# Patient Record
Sex: Male | Born: 1965
Health system: Southern US, Community
[De-identification: ages and names within clinical notes are randomized; demographics above are authoritative.]

## PROBLEM LIST (undated history)

## (undated) DIAGNOSIS — I10 Essential (primary) hypertension: Secondary | ICD-10-CM

## (undated) HISTORY — PX: HERNIA REPAIR: SHX51

---

## 2013-10-18 ENCOUNTER — Ambulatory Visit (INDEPENDENT_AMBULATORY_CARE_PROVIDER_SITE_OTHER): Payer: 59 | Admitting: Family Medicine

## 2013-10-18 VITALS — BP 146/78 | HR 98 | Temp 98.5°F | Resp 16 | Ht 67.2 in | Wt 186.6 lb

## 2013-10-18 DIAGNOSIS — B349 Viral infection, unspecified: Secondary | ICD-10-CM

## 2013-10-18 DIAGNOSIS — R51 Headache: Secondary | ICD-10-CM

## 2013-10-18 DIAGNOSIS — I10 Essential (primary) hypertension: Secondary | ICD-10-CM

## 2013-10-18 DIAGNOSIS — F3289 Other specified depressive episodes: Secondary | ICD-10-CM

## 2013-10-18 DIAGNOSIS — F32A Depression, unspecified: Secondary | ICD-10-CM

## 2013-10-18 DIAGNOSIS — B9789 Other viral agents as the cause of diseases classified elsewhere: Secondary | ICD-10-CM

## 2013-10-18 DIAGNOSIS — F329 Major depressive disorder, single episode, unspecified: Secondary | ICD-10-CM

## 2013-10-18 LAB — POCT CBC
Granulocyte percent: 65.8 %G (ref 37–80)
HCT, POC: 50.6 % (ref 43.5–53.7)
Hemoglobin: 16.3 g/dL (ref 14.1–18.1)
Lymph, poc: 2.6 (ref 0.6–3.4)
MCH, POC: 26.8 pg — AB (ref 27–31.2)
MCHC: 32.3 g/dL (ref 31.8–35.4)
MCV: 83 fL (ref 80–97)
MID (cbc): 0.6 (ref 0–0.9)
MPV: 9 fL (ref 0–99.8)
POC Granulocyte: 6.3 (ref 2–6.9)
POC LYMPH PERCENT: 27.6 %L (ref 10–50)
POC MID %: 6.6 %M (ref 0–12)
Platelet Count, POC: 230 10*3/uL (ref 142–424)
RBC: 6.09 M/uL (ref 4.69–6.13)
RDW, POC: 12.8 %
WBC: 9.5 10*3/uL (ref 4.6–10.2)

## 2013-10-18 LAB — COMPREHENSIVE METABOLIC PANEL
ALT: 23 U/L (ref 0–53)
AST: 28 U/L (ref 0–37)
Albumin: 5.1 g/dL (ref 3.5–5.2)
Alkaline Phosphatase: 56 U/L (ref 39–117)
BUN: 13 mg/dL (ref 6–23)
CO2: 28 mEq/L (ref 19–32)
Calcium: 10.3 mg/dL (ref 8.4–10.5)
Chloride: 100 mEq/L (ref 96–112)
Creat: 0.83 mg/dL (ref 0.50–1.35)
Glucose, Bld: 93 mg/dL (ref 70–99)
Potassium: 3.7 mEq/L (ref 3.5–5.3)
Sodium: 137 mEq/L (ref 135–145)
Total Bilirubin: 0.8 mg/dL (ref 0.2–1.2)
Total Protein: 8.6 g/dL — ABNORMAL HIGH (ref 6.0–8.3)

## 2013-10-18 MED ORDER — CITALOPRAM HYDROBROMIDE 20 MG PO TABS
20.0000 mg | ORAL_TABLET | Freq: Every day | ORAL | Status: DC
Start: 2013-10-18 — End: 2013-12-06

## 2013-10-18 MED ORDER — HYDROCHLOROTHIAZIDE 12.5 MG PO CAPS: 12.5000 mg | ORAL_CAPSULE | Freq: Every day | ORAL | Status: DC

## 2013-10-18 NOTE — Progress Notes (Signed)
Subjective:    Patient ID: Juan [name], male    DOB: 1965/05/12, 48 y.o.   MRN: [id number] This chart was scribed for Elvina SidleKurt Lauenstein, MD by Chestine SporeSoijett Blue, ED Scribe. The patient was seen in room 9 at 1:57 PM.   No chief complaint on file.   HPI HPI Comments: Juan [name] is a 48 y.o. male who presents to the Emergency Department complaining of a HA onset yesterday. He states that the HA is general. He states that his BP was 160/114 on his left arm. He states that he is having associated symptoms of pressure feeling in the back of the calf on both legs, diarrhea. He denies cough, SOB, fever, dysuria. He states that he took HCTZ medication with mild relief. He states that a former doctor informed him that it will lead to sexual issues.  He states that he is unemployed. 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 He states that his father has a hx of 3 strokes with the first one being at age 46. He states that his brother also had a stroke. He states that he does not have a PCP. He states that he is married and he is going through a split with her. He states that the stress has him feeling down. He states that he was married for 12 years. He states that he does want to get back with his wife.   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 He states that he is not sleeping enough. He denies taking any medications to help him through tough times. He states that he has been thinking about seeing a psychologist for the issues.   Pt BP on the right arm is 130/80 in the office. Pt BP on the left arm is 110/80 in the office. He states that he feels nauseated while in the office. Pulse 80 beats per minutes.   There are no active problems to display for this patient.  No past medical history on file. No past surgical history on file. Allergies not on file Prior to Admission medications   Not on File     Review of Systems  Constitutional: Negative for fever.  Respiratory: Negative for  cough and shortness of breath.   Genitourinary: Negative for dysuria.  Neurological: Positive for headaches.       Objective:   Physical Exam  Nursing note and vitals reviewed. Constitutional: He is oriented to person, place, and time. He appears well-developed and well-nourished. No distress.  HENT:  Head: Normocephalic and atraumatic.  Cerumen impaction on the right TM  Eyes: EOM are normal.  Neck: Normal range of motion. Neck supple. No tracheal deviation present.  Cardiovascular: Normal rate, regular rhythm and normal heart sounds.   No murmur heard. Pulmonary/Chest: Effort normal and breath sounds normal. No respiratory distress.  Musculoskeletal: Normal range of motion. He exhibits no edema.  Lymphadenopathy:    He has no cervical adenopathy.  Neurological: He is alert and oriented to person, place, and time.  Skin: Skin is warm and dry.  Psychiatric: His behavior is normal. Judgment normal.  Patient weeps when discussing recent separation from wife and daughter.    Results for orders placed in visit on 10/18/13  POCT CBC      Result Value Ref Range   WBC 9.5  4.6 - 10.2 K/uL   Lymph, poc 2.6  0.6 - 3.4   POC LYMPH PERCENT 27.6  10 - 50 %L  MID (cbc) 0.6  0 - 0.9   POC MID % 6.6  0 - 12 %M   POC Granulocyte 6.3  2 - 6.9   Granulocyte percent 65.8  37 - 80 %G   RBC 6.09  4.69 - 6.13 M/uL   Hemoglobin 16.3  14.1 - 18.1 g/dL   HCT, POC 16.1  09.6 - 53.7 %   MCV 83.0  80 - 97 fL   MCH, POC 26.8 (*) 27 - 31.2 pg   MCHC 32.3  31.8 - 35.4 g/dL   RDW, POC 04.5     Platelet Count, POC 230  142 - 424 K/uL   MPV 9.0  0 - 99.8 fL    Assessment & Plan:    I personally performed the services described in this documentation, which was scribed in my presence. The recorded information has been reviewed and is accurate.   I believe this patient's main problem is depression. He probably has an overlying viral illness and has had a history of hypertension.  Headache(784.0) -  Plan: POCT CBC, Comprehensive metabolic panel  Depression - Plan: citalopram (CELEXA) 20 MG tablet  Viral illness  Essential hypertension, benign - Plan: hydrochlorothiazide (MICROZIDE) 12.5 MG capsule  RTC one week. Given names of psychologists Signed, Elvina Sidle, MD

## 2013-10-18 NOTE — Patient Instructions (Signed)
Psychologists:  Vernell LeepKen Frazier, Judithann Saugerhompson Wyatt, Alan Ripperlaire Huprich

## 2013-12-05 ENCOUNTER — Encounter (HOSPITAL_COMMUNITY): Payer: Self-pay | Admitting: Emergency Medicine

## 2013-12-05 DIAGNOSIS — F329 Major depressive disorder, single episode, unspecified: Secondary | ICD-10-CM | POA: Insufficient documentation

## 2013-12-05 DIAGNOSIS — I10 Essential (primary) hypertension: Secondary | ICD-10-CM | POA: Insufficient documentation

## 2013-12-05 DIAGNOSIS — Z79899 Other long term (current) drug therapy: Secondary | ICD-10-CM | POA: Insufficient documentation

## 2013-12-05 NOTE — ED Notes (Signed)
Patient arrives with complaint of depression which has been ongoing for about 3 weeks. States that he is currently going through a separation from his wife and kids which has sparked these feelings. Denies SI/HI/AVH. States that he wants to seek help with these feelings. Attempted to contact insurance provider for assistance but hasn't been able to obtain help.

## 2013-12-06 ENCOUNTER — Emergency Department (HOSPITAL_COMMUNITY)
Admission: EM | Admit: 2013-12-06 | Discharge: 2013-12-06 | Disposition: A | Discharge status: Home / Self Care | Payer: 59 | Attending: Emergency Medicine | Admitting: Emergency Medicine

## 2013-12-06 DIAGNOSIS — F32A Depression, unspecified: Secondary | ICD-10-CM

## 2013-12-06 DIAGNOSIS — F329 Major depressive disorder, single episode, unspecified: Secondary | ICD-10-CM

## 2013-12-06 HISTORY — DX: Essential (primary) hypertension: I10

## 2013-12-06 LAB — CBC WITH DIFFERENTIAL/PLATELET
Basophils Absolute: 0 10*3/uL (ref 0.0–0.1)
Basophils Relative: 0 % (ref 0–1)
Eosinophils Absolute: 0.1 10*3/uL (ref 0.0–0.7)
Eosinophils Relative: 1 % (ref 0–5)
HCT: 43.3 % (ref 39.0–52.0)
Hemoglobin: 15.2 g/dL (ref 13.0–17.0)
LYMPHS PCT: 31 % (ref 12–46)
Lymphs Abs: 2.8 10*3/uL (ref 0.7–4.0)
MCH: 27.7 pg (ref 26.0–34.0)
MCHC: 35.1 g/dL (ref 30.0–36.0)
MCV: 78.9 fL (ref 78.0–100.0)
MONO ABS: 0.8 10*3/uL (ref 0.1–1.0)
Monocytes Relative: 9 % (ref 3–12)
NEUTROS ABS: 5.3 10*3/uL (ref 1.7–7.7)
Neutrophils Relative %: 59 % (ref 43–77)
Platelets: 217 10*3/uL (ref 150–400)
RBC: 5.49 MIL/uL (ref 4.22–5.81)
RDW: 12.6 % (ref 11.5–15.5)
WBC: 9.1 10*3/uL (ref 4.0–10.5)

## 2013-12-06 LAB — ETHANOL

## 2013-12-06 LAB — COMPREHENSIVE METABOLIC PANEL
ALT: 12 U/L (ref 0–53)
ANION GAP: 11 (ref 5–15)
AST: 24 U/L (ref 0–37)
Albumin: 4.1 g/dL (ref 3.5–5.2)
Alkaline Phosphatase: 66 U/L (ref 39–117)
BUN: 11 mg/dL (ref 6–23)
CHLORIDE: 99 meq/L (ref 96–112)
CO2: 28 meq/L (ref 19–32)
CREATININE: 0.8 mg/dL (ref 0.50–1.35)
Calcium: 9.5 mg/dL (ref 8.4–10.5)
GLUCOSE: 100 mg/dL — AB (ref 70–99)
Potassium: 3.6 mEq/L — ABNORMAL LOW (ref 3.7–5.3)
Sodium: 138 mEq/L (ref 137–147)
Total Bilirubin: 0.4 mg/dL (ref 0.3–1.2)
Total Protein: 8.3 g/dL (ref 6.0–8.3)

## 2013-12-06 NOTE — BH Assessment (Signed)
Assessment complete. Consulted with Armandina StammerAgnes Nwoko, NP who agreed Pt does not meet criteria for inpatient treatment and recommends Pt follow up with outpatient counseling referrals on Monday. Discussed recommendation with Dr. Marisa Severinlga Otter who agrees with recommendation. Faxed referral information for [facility], 24 crisis information and No Harm contract to Wynelle Beckmannobin Wright, RN who will present Pt with information.  Harlin RainFord Ellis Ria CommentWarrick Jr, LPC, [facility] Triage Specialist [phone]

## 2013-12-06 NOTE — ED Provider Notes (Signed)
Pt seen by Dr Lynelle DoctorKnapp, awaiting TTS evaluation.  Pt has been seen by Ala DachFord, has signed no harm contract, is not SI.  Pt to f/u with [facility] tomorrow.  Juan Mackielga M Lacreasha Hinds, MD 12/06/13 [phone]

## 2013-12-06 NOTE — Discharge Instructions (Signed)
°Depression °Depression refers to feeling sad, low, down in the dumps, blue, gloomy, or empty. In general, there are two kinds of depression: °1. Normal sadness or normal grief. This kind of depression is one that we all feel from time to time after upsetting life experiences, such as the loss of a job or the ending of a relationship. This kind of depression is considered normal, is short lived, and resolves within a few days to 2 weeks. Depression experienced after the loss of a loved one (bereavement) often lasts longer than 2 weeks but normally gets better with time. °2. Clinical depression. This kind of depression lasts longer than normal sadness or normal grief or interferes with your ability to function at home, at work, and in school. It also interferes with your personal relationships. It affects almost every aspect of your life. Clinical depression is an illness. °Symptoms of depression can also be caused by conditions other than those mentioned above, such as: °· Physical illness. Some physical illnesses, including underactive thyroid gland (hypothyroidism), severe anemia, specific types of cancer, diabetes, uncontrolled seizures, heart and lung problems, strokes, and chronic pain are commonly associated with symptoms of depression. °· Side effects of some prescription medicine. In some people, certain types of medicine can cause symptoms of depression. °· Substance abuse. Abuse of alcohol and illicit drugs can cause symptoms of depression. °SYMPTOMS °Symptoms of normal sadness and normal grief include the following: °· Feeling sad or crying for short periods of time. °· Not caring about anything (apathy). °· Difficulty sleeping or sleeping too much. °· No longer able to enjoy the things you used to enjoy. °· Desire to be by oneself all the time (social isolation). °· Lack of energy or motivation. °· Difficulty concentrating or remembering. °· Change in appetite or weight. °· Restlessness or  agitation. °Symptoms of clinical depression include the same symptoms of normal sadness or normal grief and also the following symptoms: °· Feeling sad or crying all the time. °· Feelings of guilt or worthlessness. °· Feelings of hopelessness or helplessness. °· Thoughts of suicide or the desire to harm yourself (suicidal ideation). °· Loss of touch with reality (psychotic symptoms). Seeing or hearing things that are not real (hallucinations) or having false beliefs about your life or the people around you (delusions and paranoia). °DIAGNOSIS  °The diagnosis of clinical depression is usually based on how bad the symptoms are and how long they have lasted. Your health care provider will also ask you questions about your medical history and substance use to find out if physical illness, use of prescription medicine, or substance abuse is causing your depression. Your health care provider may also order blood tests. °TREATMENT  °Often, normal sadness and normal grief do not require treatment. However, sometimes antidepressant medicine is given for bereavement to ease the depressive symptoms until they resolve. °The treatment for clinical depression depends on how bad the symptoms are but often includes antidepressant medicine, counseling with a mental health professional, or both. Your health care provider will help to determine what treatment is best for you. °Depression caused by physical illness usually goes away with appropriate medical treatment of the illness. If prescription medicine is causing depression, talk with your health care provider about stopping the medicine, decreasing the dose, or changing to another medicine. °Depression caused by the abuse of alcohol or illicit drugs goes away when you stop using these substances. Some adults need professional help in order to stop drinking or using drugs. °SEEK IMMEDIATE MEDICAL   CARE IF: °· You have thoughts about hurting yourself or others. °· You lose touch  with reality (have psychotic symptoms). °· You are taking medicine for depression and have a serious side effect. °FOR MORE INFORMATION °· National Alliance on Mental Illness: www.nami.org  °· National Institute of Mental Health: www.nimh.nih.gov  °Document Released: 02/17/2000 Document Revised: 07/06/2013 Document Reviewed: 05/21/2011 °ExitCare® Patient Information ©2015 [employer]. This information is not intended to replace advice given to you by your health care provider. Make sure you discuss any questions you have with your health care provider. °  Emergency Department Resource Guide °1) Find a Doctor and Pay Out of Pocket °Although you won't have to find out who is covered by your insurance plan, it is a good idea to ask around and get recommendations. You will then need to call the office and see if the doctor you have chosen will accept you as a new patient and what types of options they offer for patients who are self-pay. Some doctors offer discounts or will set up payment plans for their patients who do not have insurance, but you will need to ask so you aren't surprised when you get to your appointment. ° °2) Contact Your Local Health Department °Not all health departments have doctors that can see patients for sick visits, but many do, so it is worth a call to see if yours does. If you don't know where your local health department is, you can check in your phone book. The CDC also has a tool to help you locate your state's health department, and many state websites also have listings of all of their local health departments. ° °3) Find a Walk-in Clinic °If your illness is not likely to be very severe or complicated, you may want to try a walk in clinic. These are popping up all over the country in pharmacies, drugstores, and shopping centers. They're usually staffed by nurse practitioners or physician assistants that have been trained to treat common illnesses and complaints. They're usually fairly  quick and inexpensive. However, if you have serious medical issues or chronic medical problems, these are probably not your best option. ° °No Primary Care Doctor: °- Call [facility] Connect at  832-8000 - they can help you locate a primary care doctor that  accepts your insurance, provides certain services, etc. °- Physician Referral Service- 1-800-533-3463 ° °Chronic Pain Problems: °Organization         Address  Phone   Notes  °[facility]  (336) 297-2271 Patients need to be referred by their primary care doctor.  ° °Medication Assistance: °Organization         Address  Phone   Notes  °[place] [facility] [address] °[place], [place] [address] (336) 641-8030 --Must be a resident of [place] °-- Must have NO insurance coverage whatsoever (no Medicaid/ Medicare, etc.) °-- The pt. MUST have a primary care doctor that directs their care regularly and follows them in the community °  °[facility]  (866) 331-1348   °[employer]  (888) 892-1162   ° °Agencies that provide inexpensive medical care: °Organization         Address  Phone   Notes  °[facility]  (336) 832-8035   °[facility]    (336) 832-7272   °[facility] [address] °[place], [place] [address] (336) 832-4777   °[facility] [address], °[place] (336) 271-4999   °  [facility]    (336) 373-0678   °[facility]    (336) 272-1050   °[facility] ° [address], [place] Phone:  (336) 832-4444, Fax:  (336) 832-4440 Hours of Operation:  9 am - 6 pm, M-F.  Also accepts Medicaid/Medicare and self-pay.  °[facility] ° [address], [place] Phone: (336) 832-3150, Fax: (336) 832-3151. Hours of Operation:  8:30 am - 5:30 pm, M-F.  Also accepts Medicaid and self-pay.  °[facility] [place] [address], [place] Phone: (336) 878-6027    °[facility] [address], [place], [place] (336)723-1848, Ext. 123 Mondays & Thursdays: 7-9 AM.  First 15 patients are seen on a first come, first serve basis. °  ° °Medicaid-accepting [place] Providers: ° °Organization         Address  Phone   Notes  °[facility] [address], [place] (336) 641-2100 Also accepts self-pay patients.  °[facility] [address], [place] ° (336) 856-9996   °[facility] [address], [place] (336) 288-8857   °[facility] [address] [place] [address], [place] (336) 299-7000   °Veita B[facility] [address], [place]  ° (336) 373-1557 Only accepts [place] Access Medicaid patients after they have their name applied to their card.  ° °Self-Pay (no insurance) in [place]: ° °Organization         Address  Phone   Notes  °Sickle Cell Patients, [facility] [address], [place] (336) 832-1970   °[facility] [address], [place] (336) 832-4400   °[facility] ° [address], [place] (336) 992-4800   °[facility]/Dr. Osei-Bonsu ° [address] [place] [address], [place] or [address], [place] (336) 841-8500 Phone number for both [place] and [place] locations is the same.  °[facility] [address], [place] (336) 299-0000   °[facility] [address] [place] [address], [place] or [address] (336) 852-7530 °(336) 878-2260   °[facility] [address], [place] (336) 350-1642, phone; (336) 294-5005, fax Sees patients 1st and 3rd Saturday of every month.  Must not qualify for public or private insurance (i.e. Medicaid, Medicare, [place] Health Choice, Veterans' Benefits) • Household income should be no more than 200% of the poverty level •The clinic cannot treat you if you are  pregnant or think you are pregnant • Sexually transmitted diseases are not treated at the clinic.  ° °Dental Care: °Organization         Address  Phone  Notes  °[facility] [address], [place] (336) 641-6152 Accepts children up to age 21 who are enrolled in Medicaid or [place] Health Choice; pregnant women with a Medicaid card; and children who have applied for Medicaid or [place] Health Choice, but were declined, whose parents can pay a reduced fee at time of service.  °[facility] [place]  [address], [place] (336) 641-7733 Accepts children up to age 21 who are enrolled in Medicaid or [place] Health Choice; pregnant women with a Medicaid card; and children who have applied for Medicaid or [place] Health Choice, but were declined, whose   parents can pay a reduced fee at time of service.  °[facility] ° [address], [place] (336) 641-4533 Patients are seen by appointment only. Walk-ins are not accepted. [facility] will see patients 18 years of age and older. °Monday - Tuesday (8am-5pm) °Most Wednesdays (8:30-5pm) °$30 per visit, cash only  °[facility] ° [address], [place] (336) 641-4533 Patients are seen by appointment only. Walk-ins are not accepted. [facility] will see patients 18 years of age and older. °One Wednesday Evening (Monthly: Volunteer Based).  $30 per visit, cash only  °[facility]  (919) 537-3737 for adults; Children under age 4, call Graduate Pediatric Dentistry at ([phone]) [phone]. Children aged 4-14, please call (919) [phone] to request a pediatric application. ° Dental services are provided in all areas of dental care including fillings, crowns and bridges, complete and partial dentures, implants, gum treatment, root canals, and extractions. Preventive care is also provided. Treatment is provided to  both adults and children. °Patients are selected via a lottery and there is often a waiting list. °  °[facility] [address], °[place] ° (336) 763-8833 www.drcivils.com °  °[facility] [address], [place], [place] (336)723-1848, Ext. 123 Second and Fourth Thursday of each month, opens at 6:30 AM; Clinic ends at 9 AM.  Patients are seen on a first-come first-served basis, and a limited number are seen during each clinic.  ° °[facility] ° [address], [place], [place] (336) 723-7904   Eligibility Requirements °You must have lived in [place], [place], or [place] counties for at least the last three months. °  You cannot be eligible for state or federal sponsored healthcare insurance, including Veterans Administration, Medicaid, or Medicare. °  You generally cannot be eligible for healthcare insurance through your employer.  °  How to apply: °Eligibility screenings are held every Tuesday and Wednesday afternoon from 1:00 pm until 4:00 pm. You do not need an appointment for the interview!  °[facility] [address], [place], [place] 336-631-2330   °[facility]  336-342-8273   °[place] [facility]  336-703-3100   °[facility]  336-570-6415   ° °Behavioral Health Resources in the Community: °Intensive Outpatient Programs °Organization         Address  Phone  Notes  °[facility] [address], [place], [place] 336-878-6098   °[facility] [address], [place], [place] 336-832-9800   °[facility] [address], [place], [place] ° 336-882-2125   °[facility] [address],  °[place], [place] 1-800-853-5163 or 336-641-4981   °Substance Abuse Resources °Organization         Address  Phone  Notes  °[facility]  336-882-2125   °[facility]  336-784-9470   °[facility]   336-285-9073   °[facility]  336-845-3988   °Residential & Outpatient Substance Abuse Program  1-800-659-3381   °Psychological Services °Organization         Address  Phone  Notes  ° Health  336- 832-9600   °[facility]  336- 378-7881   °[facility] [address], [place] 1-800-853-5163 or 336-641-4981   ° °Mobile Crisis Teams °Organization         Address  Phone  Notes  °[facility], Mobile Crisis   Care Unit  1-877-626-1772  °Assertive °[facility] ° [address] [place], [place] 336-834-9664  °Sharon DeEsch [address] °[place] 336-554-5454  ° °Self-Help/Support Groups °Organization         Address  Phone             Notes  °[facility] - variety of support groups  336- 373-1402 Call for more information  °Narcotics Anonymous (NA), [facility] [address], °[place]  2 meetings at this location  ° °Residential Treatment Programs °Organization         Address  Phone  Notes  °[facility] [address],    °[place]  1-866-801-8205   °[facility] ° [address], [place], [place] 704-293-8524   °[facility] [address], [place] 336-845-3988 Admissions: 8am-3pm M-F  °[facility] [address]    °[place], [place] 336-841-1104   °[facility] [address], [place], [place] 336-379-7146   °[facility] [address]  °[place], [place] 336-285-9073   °[facility] Intensive Outpatient [address], [place], [place] 336-852-3033   °[facility] [address]  °[place], [place] 1-877-615-2722 or 336-784-9470   °[facility] [address] [place], [place] 336-227-7417 Accepts Medicaid  °[facility] [address]  ° [place] 1-800-659-3381 Substance Abuse/Addiction Treatment  ° °[place] Behavioral Health Resources °Organization         Address  Phone  Notes  °[facility]  (888) 581-9988   °Julie Brannon, PhD [address] [place], [place]   (336) 349-5553 or (336) 951-0000   °[facility]   [address] °[place], [place] (336) 349-4454   °[facility] [address], [place], [place] (336) 342-8316 Insurance/Medicaid/sponsorship through [facility]  °[facility] [address]                                    [place], [place] (336) 342-8316 Therapy/tele-psych/case  °[facility] [address]  ° [place], [place] (336) 349-2233    °Dr. Arfeen  (336) 349-4544   °[facility] [place]  [employer] [place] [facility] 1) [address],  °2) [address], [place] °3)  [address], [place] (336) 349-3220 °(336) 342-7768 ° °(336) 342-8140   °[place] Child Abuse [facility] (336) 342-1394 or (336) 342-3537 (After Hours)    ° °   °

## 2013-12-06 NOTE — BH Assessment (Signed)
Received call for assessment. Spoke with Dr. Marisa Severinlga Otter who said Pt has separated from wife and is seeking treatment for depression. Tele-assessment will be initiated.  Harlin RainFord Ellis Ria CommentWarrick Jr, LPC, [facility] Triage Specialist [phone]

## 2013-12-06 NOTE — ED Provider Notes (Signed)
CSN: [id number]     Arrival date & time 12/05/13  2132 History   First MD Initiated Contact with Patient 12/06/13 0000     Chief Complaint  Patient presents with  . Depression     HPI Comments: Separated for 4 months.  Last night wife would not allow him to see his daughters.  This acutely precipitated his depression.   He has been crying a lot even while he is at work.  He still is working two jobs.  Pt is not suicidal but he does not feel he can get any help.  He has no family support.  He is desperate and needs some help.  Denies drug or alcohol use.  Patient is a 48 y.o. male presenting with mental health disorder.  Mental Health Problem Presenting symptoms: depression   Onset quality:  Gradual Timing:  Constant Progression:  Worsening Associated symptoms: anhedonia     Past Medical History  Diagnosis Date  . Hypertension    Past Surgical History  Procedure Laterality Date  . Hernia repair     No family history on file. History  Substance Use Topics  . Smoking status: Never Smoker   . Smokeless tobacco: Not on file  . Alcohol Use: No    Review of Systems  All other systems reviewed and are negative.     Allergies  Pork-derived products and Shellfish allergy  Home Medications   Prior to Admission medications   Medication Sig Start Date End Date Taking? Authorizing Provider  citalopram (CELEXA) 20 MG tablet Take 1 tablet (20 mg total) by mouth daily. 10/18/13   Elvina SidleKurt Lauenstein, MD  hydrochlorothiazide (MICROZIDE) 12.5 MG capsule Take 1 capsule (12.5 mg total) by mouth daily. 10/18/13   Elvina SidleKurt Lauenstein, MD   BP 126/80  Pulse 78  Temp(Src) 98.8 F (37.1 C) (Oral)  Resp 16  SpO2 99% Physical Exam  Nursing note and vitals reviewed. Constitutional: He appears well-developed and well-nourished. No distress.  HENT:  Head: Normocephalic and atraumatic.  Right Ear: External ear normal.  Left Ear: External ear normal.  Eyes: Conjunctivae are normal. Right eye  exhibits no discharge. Left eye exhibits no discharge. No scleral icterus.  Neck: Neck supple. No tracheal deviation present.  Cardiovascular: Normal rate.   Pulmonary/Chest: Effort normal. No stridor. No respiratory distress.  Musculoskeletal: He exhibits no edema.  Neurological: He is alert. Cranial nerve deficit: no gross deficits.  Skin: Skin is warm and dry. No rash noted.  Psychiatric: He has a normal mood and affect.    ED Course  Procedures (including critical care time) Labs Review Labs Reviewed  CBC WITH DIFFERENTIAL  COMPREHENSIVE METABOLIC PANEL  URINE RAPID DRUG SCREEN (HOSP PERFORMED)  ETHANOL  labs pending at 1244am   MDM   Final diagnoses:  Depression    Pt with complaints of depression.  No SI.  Feels that his symptoms are escalating.  Will consult psychiatry.    Linwood DibblesJon Jilian West, MD 12/06/13 [phone]

## 2013-12-06 NOTE — ED Notes (Signed)
TTS now 

## 2013-12-06 NOTE — BH Assessment (Signed)
Tele Assessment Note   Juan [name] is an 48 y.o. male, separated, Hispanic who presents unaccompanied to [facility] ED requesting treatment for symptoms of depression. Pt states he has been sad and depressed since he and his wife separated four months ago. Pt reports he cheated on his wife and had to move into an apartment where he lives alone. Today he became upset because he wanted to see his daughters and his wife said they didn't want to see him because they were upset he went to [place] to see his two sons from a previous marriage. Pt says he has no family in the [place] and contacted his wife's family for support and they made it clear they didn't like him and did not want Pt and his wife to get back together. Pt says he is a member of a [employer] and tried to talk to his pastor but he was unavailable. He says he attempted to contact [facility] because he felt he really wanted to talk to a professional today but they were not available so his wife, who is a [employer], recommended he come to the emergency room. Pt states he really didn't feel he needed to come to an emergency room but he really wanted to speak to someone today and he wanted to follow his wife's recommendation.  Pt reports he has no history of depression and his mood was good before he and his wife separated. He reports symptoms including crying spells, poor sleep, decreased appetite and feelings of sadness, loneliness and guilt. He denies current suicidal ideation or any history of suicidal gestures. He denies any history of intentional self-injurious behaviors. He denies any homicidal ideation or history of violence. He denies any history of psychotic symptoms. He denies any history of alcohol abuse or substance use.  Pt states he has no history of inpatient or outpatient mental health treatment. He says his mother has a history of depression. He says he went to urgent care and was prescribed an  antidepressant but decided not to take it when he read the possible side effects. Pt states he doesn't want to use psychiatric medication and just wants a counselor who can help him. Pt states he knows he has problems related to being unfaithful and he wants to work on these issues and repair his marriage. Pt states he feels his wife's family doesn't feel he is worthy of his wife, that he isn't talented enough, and he is working two jobs to prove to his wife and her family that he is responsible.  Pt is dressed in hospital gown, alert, oriented x4 with normal speech and normal motor behavior. Eye contact is good. Pt was tearful at times during assessment. Pt's mood is sad and guilty and affect is congruent with mood. Thought process is coherent and relevant. There is no indication Pt is currently responding to internal stimuli or experiencing delusional thought content. Pt was cooperative throughout assessment. He said he felt better just having the chance to talk to someone. Pt contracts for safety and says he will definitely contact his [facility] on Monday.   Axis I: 309.0 Adjustment Disorder with Depressed Mood. Axis II: Deferred Axis III:  Past Medical History  Diagnosis Date  . Hypertension    Axis IV: problems with primary support group Axis V: GAF=50  Past Medical History:  Past Medical History  Diagnosis Date  . Hypertension     Past Surgical History  Procedure Laterality Date  .  Hernia repair      Family History: No family history on file.  Social History:  reports that he has never smoked. He does not have any smokeless tobacco history on file. He reports that he does not drink alcohol or use illicit drugs.  Additional Social History:  Alcohol / Drug Use Pain Medications: Denies use Prescriptions: Denies use Over the Counter: Denies use History of alcohol / drug use?: No history of alcohol / drug abuse Longest period of sobriety (when/how long): NA  CIWA: CIWA-Ar BP: 138/95  mmHg Pulse Rate: 78 COWS:    PATIENT STRENGTHS: (choose at least two) Ability for insight Average or above average intelligence Capable of independent living MetallurgistCommunication skills Financial means General fund of knowledge Motivation for treatment/growth Physical Health Religious Affiliation Work skills  Allergies:  Allergies  Allergen Reactions  . Pork-Derived Products Anaphylaxis  . Shellfish Allergy Anaphylaxis    Home Medications:  (Not in a hospital admission)  OB/GYN Status:  No LMP for male patient.  General Assessment Data [facility] Is this a Tele or Face-to-Face Assessment?: Tele Assessment Is this an Initial Assessment or a Re-assessment for this encounter?: Initial Assessment Living Arrangements: Alone Can pt return to current living arrangement?: Yes Admission Status: Voluntary Is patient capable of signing voluntary admission?: Yes Transfer from: Home Referral Source: Self/Family/Friend     [facility] Living Arrangements: Alone Name of Psychiatrist: None Name of Therapist: None  Education Status Is patient currently in school?: No Current Grade: NA Highest grade of school patient has completed: NA Name of school: NA Contact person: NA  Risk to self with the past 6 months Suicidal Ideation: No Suicidal Intent: No Is patient at risk for suicide?: No Suicidal Plan?: No Access to Means: No What has been your use of drugs/alcohol within the last 12 months?: Pt denies Previous Attempts/Gestures: No How many times?: 0 Other Self Harm Risks: None Triggers for Past Attempts: None known Intentional Self Injurious Behavior: None Family Suicide History: No Recent stressful life event(s): Other (Comment) (Marital separation) Persecutory voices/beliefs?: No Depression: Yes Depression Symptoms: Despondent;Tearfulness;Guilt;Loss of interest in usual pleasures Substance abuse history and/or treatment for substance abuse?:  No Suicide prevention information given to non-admitted patients: Yes  Risk to Others within the past 6 months Homicidal Ideation: No Thoughts of Harm to Others: No Current Homicidal Intent: No Current Homicidal Plan: No Access to Homicidal Means: No Identified Victim: None History of harm to others?: No Assessment of Violence: None Noted Violent Behavior Description: Pt denies history of violence Does patient have access to weapons?: No Criminal Charges Pending?: No Does patient have a court date: No  Psychosis Hallucinations: None noted Delusions: None noted  Mental Status Report Appear/Hygiene: In hospital gown Eye Contact: Good Motor Activity: Unremarkable Speech: Logical/coherent Level of Consciousness: Alert Mood: Depressed;Guilty Affect: Appropriate to circumstance Anxiety Level: Minimal Thought Processes: Coherent;Relevant Judgement: Unimpaired Orientation: Person;Place;Time;Situation Obsessive Compulsive Thoughts/Behaviors: None  Cognitive Functioning Concentration: Normal Memory: Recent Intact;Remote Intact IQ: Average Insight: Good Impulse Control: Good Appetite: Fair Weight Loss: 0 Weight Gain: 0 Sleep: Decreased Total Hours of Sleep: 5 Vegetative Symptoms: None  ADLScreening [facility] Assessment [facility]) Patient's cognitive ability adequate to safely complete daily activities?: Yes Patient able to express need for assistance with ADLs?: Yes Independently performs ADLs?: Yes (appropriate for developmental age)  Prior Inpatient Therapy Prior Inpatient Therapy: No Prior Therapy Dates: NA Prior Therapy Facilty/Provider(s): NA Reason for Treatment: NA  Prior Outpatient Therapy Prior Outpatient Therapy:  No Prior Therapy Dates: NA Prior Therapy Facilty/Provider(s): NA Reason for Treatment: NA  ADL Screening (condition at time of admission) Patient's cognitive ability adequate to safely complete daily activities?: Yes Is the patient deaf or have  difficulty hearing?: No Does the patient have difficulty seeing, even when wearing glasses/contacts?: No Does the patient have difficulty concentrating, remembering, or making decisions?: No Patient able to express need for assistance with ADLs?: Yes Does the patient have difficulty dressing or bathing?: No Independently performs ADLs?: Yes (appropriate for developmental age) Does the patient have difficulty walking or climbing stairs?: No Weakness of Legs: None Weakness of Arms/Hands: None  Home Assistive Devices/Equipment Home Assistive Devices/Equipment: None    Abuse/Neglect Assessment (Assessment to be complete while patient is alone) Physical Abuse: Denies Verbal Abuse: Denies Sexual Abuse: Denies Exploitation of patient/patient's resources: Denies Self-Neglect: Denies Values / Beliefs Cultural Requests During Hospitalization: None Spiritual Requests During Hospitalization: None   Advance Directives (For Healthcare) Does patient have an advance directive?: No Would patient like information on creating an advanced directive?: No - patient declined information Nutrition Screen- [facility] Adult/[facility] Patient's home diet: Regular  Additional Information 1:1 In Past 12 Months?: No CIRT Risk: No Elopement Risk: No Does patient have medical clearance?: Yes     Disposition: Consulted with Armandina Stammer, NP who agreed Pt does not meet criteria for inpatient treatment and recommends Pt follow up with outpatient counseling referrals on Monday. Discussed recommendation with Dr. Marisa Severin who agrees with recommendation. Faxed referral information for [facility], 24 crisis information and No Harm contract to Wynelle Beckmann, RN who will present Pt with information.  Disposition Initial Assessment Completed for this Encounter: Yes Disposition of Patient: Outpatient treatment Type of outpatient treatment: Adult  Pamalee Leyden, [facility], [facility] Triage Specialist [phone]   Pamalee Leyden 12/06/2013 3:04 AM

## 2014-01-01 ENCOUNTER — Ambulatory Visit (INDEPENDENT_AMBULATORY_CARE_PROVIDER_SITE_OTHER): Payer: 59 | Admitting: Family Medicine

## 2014-01-01 VITALS — BP 122/78 | HR 85 | Temp 98.2°F | Resp 16 | Ht 67.5 in | Wt 187.2 lb

## 2014-01-01 DIAGNOSIS — K029 Dental caries, unspecified: Secondary | ICD-10-CM

## 2014-01-01 DIAGNOSIS — H9202 Otalgia, left ear: Secondary | ICD-10-CM

## 2014-01-01 DIAGNOSIS — F4321 Adjustment disorder with depressed mood: Secondary | ICD-10-CM

## 2014-01-01 DIAGNOSIS — K088 Other specified disorders of teeth and supporting structures: Secondary | ICD-10-CM

## 2014-01-01 DIAGNOSIS — H00022 Hordeolum internum right lower eyelid: Secondary | ICD-10-CM

## 2014-01-01 DIAGNOSIS — K0889 Other specified disorders of teeth and supporting structures: Secondary | ICD-10-CM

## 2014-01-01 DIAGNOSIS — J069 Acute upper respiratory infection, unspecified: Secondary | ICD-10-CM

## 2014-01-01 MED ORDER — ERYTHROMYCIN 5 MG/GM OP OINT: TOPICAL_OINTMENT | OPHTHALMIC | Status: DC

## 2014-01-01 NOTE — Progress Notes (Signed)
Subjective:    Patient ID: Juan Juan [name], male    DOB: 10-25-1965, 48 y.o.   MRN: [id number]  This chart was scribed for Juan Staggers, MD by Juan Juan [name], ED Scribe. This patient was seen in room 14 and the patient's care was started at 11:09 AM.   Chief Complaint  Patient presents with  . Eye Problem    pt states he may have pink eye in right eye and left as well  . Dental Pain    left side of mouth hurts;   . Otalgia    left ear pain    HPI  HPI Comments: Juan Juan [name] is a 48 y.o. male who presents to the [facility] complaining of redness to his right eye with onset 3 days ago. He states it began to feel painful and itching yesterday, worsening today. He states his left eye is beginning to have symptoms now as well. He states light does not exacerbate his problems. He denies vision change. He denies contact with anybody with similar symptoms. He states he works in [employer], [employer] and works 2 jobs, but is not aware of getting anything into his eye at work.  He also complains of left ear pain since this morning. He denies drainage, discharge, or fever.   He complains of left lower dental pain with onset 2 weeks ago. He states the severity has not changed significantly since onset. He states the pain is constant and does not seem to be worse with any particular kinds of foods. He states a left upper tooth is starting to hurt too since yesterday. He states he has not seen a dentist in 4-5 years.  He states he has not applied any topical medication or taken any medication. He reports associated rhinorrhea and congestion for the past few days but denies cough. He states he has been depressed lately because he and his wife separated 5 months ago, which is why he has picked up a second job. He states he has been seeing a psychologist at [facility] with significant improvement but has not felt the need to use his prescribed medications. He denies suicidal ideation. He denies alcohol or  drug use.  There are no active problems to display for this patient.  Past Medical History  Diagnosis Date  . Hypertension    Past Surgical History  Procedure Laterality Date  . Hernia repair     Allergies  Allergen Reactions  . Pork-Derived Products Anaphylaxis  . Shellfish Allergy Anaphylaxis   Prior to Admission medications   Medication Sig Start Date End Date Taking? Authorizing Provider  hydrochlorothiazide (MICROZIDE) 12.5 MG capsule Take 12.5 mg by mouth daily.   Yes Historical Provider, MD   History   Social History  . Marital Status: Married    Spouse Name: N/A    Number of Children: N/A  . Years of Education: N/A   Occupational History  . Not on file.   Social History Main Topics  . Smoking status: Never Smoker   . Smokeless tobacco: Not on file  . Alcohol Use: No  . Drug Use: No  . Sexual Activity: Not on file   Other Topics Concern  . Not on file   Social History Narrative  . No narrative on file    Review of Systems  Constitutional: Negative for fever.  HENT: Positive for congestion, dental problem, ear pain and rhinorrhea. Negative for ear discharge and hearing loss.   Eyes: Positive for pain, redness  and itching. Negative for photophobia, discharge and visual disturbance.  Psychiatric/Behavioral: Negative for suicidal ideas.       Feeling depressed       Objective:   Physical Exam  Vitals reviewed. Constitutional: He is oriented to person, place, and time. He appears well-developed and well-nourished.  HENT:  Juan [name]: Normocephalic and atraumatic.  Right Ear: Tympanic membrane, external ear and ear canal normal.  Left Ear: Tympanic membrane, external ear and ear canal normal.  Nose: No rhinorrhea.  Mouth/Throat: Oropharynx is clear and moist and mucous membranes are normal. No oropharyngeal exudate or posterior oropharyngeal erythema.  Left pinna nontender. Canal is clear. TM is pearly gray. Metal fillings in the molars on lower left. It  appears that the second molar has decay. No gum pain or swelling. It is tender with percussion of that affected tooth only. On the left upper teeth, he also has some filling with some decay along the posterior aspect of the last molar. No swelling to the gum or pain along the gum line. Jaw nontender. Sinuses nontender.  Eyes: Conjunctivae are normal. Pupils are equal, round, and reactive to light.  Neck: Neck supple.  Cardiovascular: Normal rate, regular rhythm, normal heart sounds and intact distal pulses.   No murmur heard. Minimal injection bilaterally but no drainage to canthi . On the right lower lid, there is a stye on the mid aspect internally with minimal erythema and edema of lid involved in middle area only.  Pulmonary/Chest: Effort normal and breath sounds normal. He has no wheezes. He has no rhonchi. He has no rales.  Abdominal: Soft. There is no tenderness.  Lymphadenopathy:    He has no cervical adenopathy.  Neurological: He is alert and oriented to person, place, and time.  Skin: Skin is warm and dry. No rash noted.  Psychiatric: He has a normal mood and affect. His behavior is normal.    Filed Vitals:   01/01/14 1058  BP: 122/78  Pulse: 85  Temp: 98.2 F (36.8 C)  TempSrc: Oral  Resp: 16  Height: 5' 7.5" (1.715 m)  Weight: 187 lb 3.2 oz (84.913 kg)  SpO2: 98%    Visual Acuity Screening   Right eye Left eye Both eyes  Without correction: 20/15 -1 20/15 20/15  With correction:          Assessment & Plan:   Juan Juan [name] is a 48 y.o. male Hordeolum internum of right lower eyelid - Plan: erythromycin (ROMYCIN) ophthalmic ointment  - warm compresses, sx care, Emycin ointment. rtc precautions.   Dental caries, Tooth pain  -no apparent abscess. Numbers given to follow up with dentist in next week. Rtc/er precautions.   Otalgia of left ear. Acute upper respiratory infection  -reassuring exam, referred pain form tooth vs. Pressure from congestion with URI. Sx care, rtc  if worsens.   Adjustment disorder with depressed mood  -in counseling. Denies SI. Exercise discussed and to rtc if persistent sx's to discuss other medication options including SSRI if needed.  Sooner if worse.   Meds ordered this encounter  Medications  . erythromycin [facility]) ophthalmic ointment    Sig: 1/2 inch ribbon to lower right eyelid 4 times per day for 1 week.    Dispense:  3.5 g    Refill:  0   Patient Instructions  For the stye - start warm compresses and ointment.   For dental pain and decay - call dentist to be seen in next 10 days of possible.  If gum  pain, swelling or fever - return here or emergency room. Dental referral center: [phone] [facility]: [phone]  Your ear pain may be due to upper respiratory infection/cold virus. Tylenol, fluids, rest and if fever or worsened ear pain - return for recheck.   Continue counseling for depression, and exercise as able to help manage these symptoms as well. Let me know if we can help you here with these symptoms.    Otalgia The most common reason for this in children is an infection of the middle ear. Pain from the middle ear is usually caused by a build-up of fluid and pressure behind the eardrum. Pain from an earache can be sharp, dull, or burning. The pain may be temporary or constant. The middle ear is connected to the nasal passages by a short narrow tube called the Eustachian tube. The Eustachian tube allows fluid to drain out of the middle ear, and helps keep the pressure in your ear equalized. CAUSES  A cold or allergy can block the Eustachian tube with inflammation and the build-up of secretions. This is especially likely in small children, because their Eustachian tube is shorter and more horizontal. When the Eustachian tube closes, the normal flow of fluid from the middle ear is stopped. Fluid can accumulate and cause stuffiness, pain, hearing loss, and an ear infection if germs start growing in this  area. SYMPTOMS  The symptoms of an ear infection may include fever, ear pain, fussiness, increased crying, and irritability. Many children will have temporary and minor hearing loss during and right after an ear infection. Permanent hearing loss is rare, but the risk increases the more infections a child has. Other causes of ear pain include retained water in the outer ear canal from swimming and bathing. Ear pain in adults is less likely to be from an ear infection. Ear pain may be referred from other locations. Referred pain may be from the joint between your jaw and the skull. It may also come from a tooth problem or problems in the neck. Other causes of ear pain include:  A foreign body in the ear.  Outer ear infection.  Sinus infections.  Impacted ear wax.  Ear injury.  Arthritis of the jaw or TMJ problems.  Middle ear infection.  Tooth infections.  Sore throat with pain to the ears. DIAGNOSIS  Your caregiver can usually make the diagnosis by examining you. Sometimes other special studies, including x-rays and lab work may be necessary. TREATMENT   If antibiotics were prescribed, use them as directed and finish them even if you or your child's symptoms seem to be improved.  Sometimes PE tubes are needed in children. These are little plastic tubes which are put into the eardrum during a simple surgical procedure. They allow fluid to drain easier and allow the pressure in the middle ear to equalize. This helps relieve the ear pain caused by pressure changes. HOME CARE INSTRUCTIONS   Only take over-the-counter or prescription medicines for pain, discomfort, or fever as directed by your caregiver. DO NOT GIVE CHILDREN ASPIRIN because of the association of Reye's Syndrome in children taking aspirin.  Use a cold pack applied to the outer ear for 15-20 minutes, 03-04 times per day or as needed may reduce pain. Do not apply ice directly to the skin. You may cause frost  bite.  Over-the-counter ear drops used as directed may be effective. Your caregiver may sometimes prescribe ear drops.  Resting in an upright position may help reduce pressure in  the middle ear and relieve pain.  Ear pain caused by rapidly descending from high altitudes can be relieved by swallowing or chewing gum. Allowing infants to suck on a bottle during airplane travel can help.  Do not smoke in the house or near children. If you are unable to quit smoking, smoke outside.  Control allergies. SEEK IMMEDIATE MEDICAL CARE IF:   You or your child are becoming sicker.  Pain or fever relief is not obtained with medicine.  You or your child's symptoms (pain, fever, or irritability) do not improve within 24 to 48 hours or as instructed.  Severe pain suddenly stops hurting. This may indicate a ruptured eardrum.  You or your children develop new problems such as severe headaches, stiff neck, difficulty swallowing, or swelling of the face or around the ear. Document Released: 10/07/2003 Document Revised: 05/14/2011 Document Reviewed: 02/11/2008 [facility] Patient Information 2015 [place], [place]. This information is not intended to replace advice given to you by your health care provider. Make sure you discuss any questions you have with your health care provider.   Sty A sty (hordeolum) is an infection of a gland in the eyelid located at the base of the eyelash. A sty may develop a white or yellow Juan [name] of pus. It can be puffy (swollen). Usually, the sty will burst and pus will come out on its own. They do not leave lumps in the eyelid once they drain. A sty is often confused with another form of cyst of the eyelid called a chalazion. Chalazions occur within the eyelid and not on the edge where the bases of the eyelashes are. They often are red, sore and then form firm lumps in the eyelid. CAUSES   Germs (bacteria).  Lasting (chronic) eyelid inflammation. SYMPTOMS   Tenderness, redness  and swelling along the edge of the eyelid at the base of the eyelashes.  Sometimes, there is a white or yellow Juan [name] of pus. It may or may not drain. DIAGNOSIS  An ophthalmologist will be able to distinguish between a sty and a chalazion and treat the condition appropriately.  TREATMENT   Styes are typically treated with warm packs (compresses) until drainage occurs.  In rare cases, medicines that kill germs (antibiotics) may be prescribed. These antibiotics may be in the form of drops, cream or pills.  If a hard lump has formed, it is generally necessary to do a small incision and remove the hardened contents of the cyst in a minor surgical procedure done in the office.  In suspicious cases, your caregiver may send the contents of the cyst to the lab to be certain that it is not a rare, but dangerous form of cancer of the glands of the eyelid. HOME CARE INSTRUCTIONS   Wash your hands often and dry them with a clean towel. Avoid touching your eyelid. This may spread the infection to other parts of the eye.  Apply heat to your eyelid for 10 to 20 minutes, several times a day, to ease pain and help to heal it faster.  Do not squeeze the sty. Allow it to drain on its own. Wash your eyelid carefully 3 to 4 times per day to remove any pus. SEEK IMMEDIATE MEDICAL CARE IF:   Your eye becomes painful or puffy (swollen).  Your vision changes.  Your sty does not drain by itself within 3 days.  Your sty comes back within a short period of time, even with treatment.  You have redness (inflammation) around the eye.  You have a fever. Document Released: 11/29/2004 Document Revised: 05/14/2011 Document Reviewed: 06/05/2013 [facility] Patient Information 2015 [place], [place]. This information is not intended to replace advice given to you by your health care provider. Make sure you discuss any questions you have with your health care provider.  Dental Caries Dental caries (also called tooth decay)  is the most common oral disease. It can occur at any age but is more common in children and young adults.  HOW DENTAL CARIES DEVELOPS  The process of decay begins when bacteria and foods (particularly sugars and starches) combine in your mouth to produce plaque. Plaque is a substance that sticks to the hard, outer surface of a tooth (enamel). The bacteria in plaque produce acids that attack enamel. These acids may also attack the root surface of a tooth (cementum) if it is exposed. Repeated attacks dissolve these surfaces and create holes in the tooth (cavities). If left untreated, the acids destroy the other layers of the tooth.  RISK FACTORS  Frequent sipping of sugary beverages.   Frequent snacking on sugary and starchy foods, especially those that easily get stuck in the teeth.   Poor oral hygiene.   Dry mouth.   Substance abuse such as methamphetamine abuse.   Broken or poor-fitting dental restorations.   Eating disorders.   Gastroesophageal reflux disease (GERD).   Certain radiation treatments to the Juan [name] and neck. SYMPTOMS In the early stages of dental caries, symptoms are seldom present. Sometimes white, chalky areas may be seen on the enamel or other tooth layers. In later stages, symptoms may include:  Pits and holes on the enamel.  Toothache after sweet, hot, or cold foods or drinks are consumed.  Pain around the tooth.  Swelling around the tooth. DIAGNOSIS  Most of the time, dental caries is detected during a regular dental checkup. A diagnosis is made after a thorough medical and dental history is taken and the surfaces of your teeth are checked for signs of dental caries. Sometimes special instruments, such as lasers, are used to check for dental caries. Dental X-ray exams may be taken so that areas not visible to the eye (such as between the contact areas of the teeth) can be checked for cavities.  TREATMENT  If dental caries is in its early stages, it may be  reversed with a fluoride treatment or an application of a remineralizing agent at the dental office. Thorough brushing and flossing at home is needed to aid these treatments. If it is in its later stages, treatment depends on the location and extent of tooth destruction:   If a small area of the tooth has been destroyed, the destroyed area will be removed and cavities will be filled with a material such as gold, silver amalgam, or composite resin.   If a large area of the tooth has been destroyed, the destroyed area will be removed and a cap (crown) will be fitted over the remaining tooth structure.   If the center part of the tooth (pulp) is affected, a procedure called a root canal will be needed before a filling or crown can be placed.   If most of the tooth has been destroyed, the tooth may need to be pulled (extracted). HOME CARE INSTRUCTIONS You can prevent, stop, or reverse dental caries at home by practicing good oral hygiene. Good oral hygiene includes:  Thoroughly cleaning your teeth at least twice a day with a toothbrush and dental floss.   Using a fluoride toothpaste. A fluoride  mouth rinse may also be used if recommended by your dentist or health care provider.   Restricting the amount of sugary and starchy foods and sugary liquids you consume.   Avoiding frequent snacking on these foods and sipping of these liquids.   Keeping regular visits with a dentist for checkups and cleanings. PREVENTION   Practice good oral hygiene.  Consider a dental sealant. A dental sealant is a coating material that is applied by your dentist to the pits and grooves of teeth. The sealant prevents food from being trapped in them. It may protect the teeth for several years.  Ask about fluoride supplements if you live in a community without fluorinated water or with water that has a low fluoride content. Use fluoride supplements as directed by your dentist or health care provider.  Allow  fluoride varnish applications to teeth if directed by your dentist or health care provider. Document Released: 11/11/2001 Document Revised: 07/06/2013 Document Reviewed: 02/22/2012 [facility] Patient Information 2015 [place], [place]. This information is not intended to replace advice given to you by your health care provider. Make sure you discuss any questions you have with your health care provider.     I personally performed the services described in this documentation, which was scribed in my presence. The recorded information has been reviewed and considered, and addended by me as needed.

## 2014-01-01 NOTE — Patient Instructions (Signed)
For the stye - start warm compresses and ointment.   For dental pain and decay - call dentist to be seen in next 10 days of possible.  If gum pain, swelling or fever - return here or emergency room. Dental referral center: [phone] [facility]: [phone]  Your ear pain may be due to upper respiratory infection/cold virus. Tylenol, fluids, rest and if fever or worsened ear pain - return for recheck.   Continue counseling for depression, and exercise as able to help manage these symptoms as well. Let me know if we can help you here with these symptoms.    Otalgia The most common reason for this in children is an infection of the middle ear. Pain from the middle ear is usually caused by a build-up of fluid and pressure behind the eardrum. Pain from an earache can be sharp, dull, or burning. The pain may be temporary or constant. The middle ear is connected to the nasal passages by a short narrow tube called the Eustachian tube. The Eustachian tube allows fluid to drain out of the middle ear, and helps keep the pressure in your ear equalized. CAUSES  A cold or allergy can block the Eustachian tube with inflammation and the build-up of secretions. This is especially likely in small children, because their Eustachian tube is shorter and more horizontal. When the Eustachian tube closes, the normal flow of fluid from the middle ear is stopped. Fluid can accumulate and cause stuffiness, pain, hearing loss, and an ear infection if germs start growing in this area. SYMPTOMS  The symptoms of an ear infection may include fever, ear pain, fussiness, increased crying, and irritability. Many children will have temporary and minor hearing loss during and right after an ear infection. Permanent hearing loss is rare, but the risk increases the more infections a child has. Other causes of ear pain include retained water in the outer ear canal from swimming and bathing. Ear pain in adults is less likely to  be from an ear infection. Ear pain may be referred from other locations. Referred pain may be from the joint between your jaw and the skull. It may also come from a tooth problem or problems in the neck. Other causes of ear pain include:  A foreign body in the ear.  Outer ear infection.  Sinus infections.  Impacted ear wax.  Ear injury.  Arthritis of the jaw or TMJ problems.  Middle ear infection.  Tooth infections.  Sore throat with pain to the ears. DIAGNOSIS  Your caregiver can usually make the diagnosis by examining you. Sometimes other special studies, including x-rays and lab work may be necessary. TREATMENT   If antibiotics were prescribed, use them as directed and finish them even if you or your child's symptoms seem to be improved.  Sometimes PE tubes are needed in children. These are little plastic tubes which are put into the eardrum during a simple surgical procedure. They allow fluid to drain easier and allow the pressure in the middle ear to equalize. This helps relieve the ear pain caused by pressure changes. HOME CARE INSTRUCTIONS   Only take over-the-counter or prescription medicines for pain, discomfort, or fever as directed by your caregiver. DO NOT GIVE CHILDREN ASPIRIN because of the association of Reye's Syndrome in children taking aspirin.  Use a cold pack applied to the outer ear for 15-20 minutes, 03-04 times per day or as needed may reduce pain. Do not apply ice directly to the skin. You may cause  frost bite.  Over-the-counter ear drops used as directed may be effective. Your caregiver may sometimes prescribe ear drops.  Resting in an upright position may help reduce pressure in the middle ear and relieve pain.  Ear pain caused by rapidly descending from high altitudes can be relieved by swallowing or chewing gum. Allowing infants to suck on a bottle during airplane travel can help.  Do not smoke in the house or near children. If you are unable to quit  smoking, smoke outside.  Control allergies. SEEK IMMEDIATE MEDICAL CARE IF:   You or your child are becoming sicker.  Pain or fever relief is not obtained with medicine.  You or your child's symptoms (pain, fever, or irritability) do not improve within 24 to 48 hours or as instructed.  Severe pain suddenly stops hurting. This may indicate a ruptured eardrum.  You or your children develop new problems such as severe headaches, stiff neck, difficulty swallowing, or swelling of the face or around the ear. Document Released: 10/07/2003 Document Revised: 05/14/2011 Document Reviewed: 02/11/2008 [facility] Patient Information 2015 [place], [place]. This information is not intended to replace advice given to you by your health care provider. Make sure you discuss any questions you have with your health care provider.   Sty A sty (hordeolum) is an infection of a gland in the eyelid located at the base of the eyelash. A sty may develop a white or yellow head of pus. It can be puffy (swollen). Usually, the sty will burst and pus will come out on its own. They do not leave lumps in the eyelid once they drain. A sty is often confused with another form of cyst of the eyelid called a chalazion. Chalazions occur within the eyelid and not on the edge where the bases of the eyelashes are. They often are red, sore and then form firm lumps in the eyelid. CAUSES   Germs (bacteria).  Lasting (chronic) eyelid inflammation. SYMPTOMS   Tenderness, redness and swelling along the edge of the eyelid at the base of the eyelashes.  Sometimes, there is a white or yellow head of pus. It may or may not drain. DIAGNOSIS  An ophthalmologist will be able to distinguish between a sty and a chalazion and treat the condition appropriately.  TREATMENT   Styes are typically treated with warm packs (compresses) until drainage occurs.  In rare cases, medicines that kill germs (antibiotics) may be prescribed. These  antibiotics may be in the form of drops, cream or pills.  If a hard lump has formed, it is generally necessary to do a small incision and remove the hardened contents of the cyst in a minor surgical procedure done in the office.  In suspicious cases, your caregiver may send the contents of the cyst to the lab to be certain that it is not a rare, but dangerous form of cancer of the glands of the eyelid. HOME CARE INSTRUCTIONS   Wash your hands often and dry them with a clean towel. Avoid touching your eyelid. This may spread the infection to other parts of the eye.  Apply heat to your eyelid for 10 to 20 minutes, several times a day, to ease pain and help to heal it faster.  Do not squeeze the sty. Allow it to drain on its own. Wash your eyelid carefully 3 to 4 times per day to remove any pus. SEEK IMMEDIATE MEDICAL CARE IF:   Your eye becomes painful or puffy (swollen).  Your vision changes.  Your  sty does not drain by itself within 3 days.  Your sty comes back within a short period of time, even with treatment.  You have redness (inflammation) around the eye.  You have a fever. Document Released: 11/29/2004 Document Revised: 05/14/2011 Document Reviewed: 06/05/2013 [facility] Patient Information 2015 [place], [place]. This information is not intended to replace advice given to you by your health care provider. Make sure you discuss any questions you have with your health care provider.  Dental Caries Dental caries (also called tooth decay) is the most common oral disease. It can occur at any age but is more common in children and young adults.  HOW DENTAL CARIES DEVELOPS  The process of decay begins when bacteria and foods (particularly sugars and starches) combine in your mouth to produce plaque. Plaque is a substance that sticks to the hard, outer surface of a tooth (enamel). The bacteria in plaque produce acids that attack enamel. These acids may also attack the root surface of a  tooth (cementum) if it is exposed. Repeated attacks dissolve these surfaces and create holes in the tooth (cavities). If left untreated, the acids destroy the other layers of the tooth.  RISK FACTORS  Frequent sipping of sugary beverages.   Frequent snacking on sugary and starchy foods, especially those that easily get stuck in the teeth.   Poor oral hygiene.   Dry mouth.   Substance abuse such as methamphetamine abuse.   Broken or poor-fitting dental restorations.   Eating disorders.   Gastroesophageal reflux disease (GERD).   Certain radiation treatments to the head and neck. SYMPTOMS In the early stages of dental caries, symptoms are seldom present. Sometimes white, chalky areas may be seen on the enamel or other tooth layers. In later stages, symptoms may include:  Pits and holes on the enamel.  Toothache after sweet, hot, or cold foods or drinks are consumed.  Pain around the tooth.  Swelling around the tooth. DIAGNOSIS  Most of the time, dental caries is detected during a regular dental checkup. A diagnosis is made after a thorough medical and dental history is taken and the surfaces of your teeth are checked for signs of dental caries. Sometimes special instruments, such as lasers, are used to check for dental caries. Dental X-ray exams may be taken so that areas not visible to the eye (such as between the contact areas of the teeth) can be checked for cavities.  TREATMENT  If dental caries is in its early stages, it may be reversed with a fluoride treatment or an application of a remineralizing agent at the dental office. Thorough brushing and flossing at home is needed to aid these treatments. If it is in its later stages, treatment depends on the location and extent of tooth destruction:   If a small area of the tooth has been destroyed, the destroyed area will be removed and cavities will be filled with a material such as gold, silver amalgam, or composite resin.    If a large area of the tooth has been destroyed, the destroyed area will be removed and a cap (crown) will be fitted over the remaining tooth structure.   If the center part of the tooth (pulp) is affected, a procedure called a root canal will be needed before a filling or crown can be placed.   If most of the tooth has been destroyed, the tooth may need to be pulled (extracted). HOME CARE INSTRUCTIONS You can prevent, stop, or reverse dental caries at home by  practicing good oral hygiene. Good oral hygiene includes:  Thoroughly cleaning your teeth at least twice a day with a toothbrush and dental floss.   Using a fluoride toothpaste. A fluoride mouth rinse may also be used if recommended by your dentist or health care provider.   Restricting the amount of sugary and starchy foods and sugary liquids you consume.   Avoiding frequent snacking on these foods and sipping of these liquids.   Keeping regular visits with a dentist for checkups and cleanings. PREVENTION   Practice good oral hygiene.  Consider a dental sealant. A dental sealant is a coating material that is applied by your dentist to the pits and grooves of teeth. The sealant prevents food from being trapped in them. It may protect the teeth for several years.  Ask about fluoride supplements if you live in a community without fluorinated water or with water that has a low fluoride content. Use fluoride supplements as directed by your dentist or health care provider.  Allow fluoride varnish applications to teeth if directed by your dentist or health care provider. Document Released: 11/11/2001 Document Revised: 07/06/2013 Document Reviewed: 02/22/2012 [facility] Patient Information 2015 [place], [place]. This information is not intended to replace advice given to you by your health care provider. Make sure you discuss any questions you have with your health care provider.

## 2014-03-25 ENCOUNTER — Ambulatory Visit (INDEPENDENT_AMBULATORY_CARE_PROVIDER_SITE_OTHER): Payer: 59 | Admitting: Physician Assistant

## 2014-03-25 VITALS — BP 124/78 | HR 86 | Temp 98.3°F | Resp 17 | Ht 66.5 in | Wt 189.0 lb

## 2014-03-25 DIAGNOSIS — R51 Headache: Secondary | ICD-10-CM

## 2014-03-25 DIAGNOSIS — R0981 Nasal congestion: Secondary | ICD-10-CM

## 2014-03-25 DIAGNOSIS — R519 Headache, unspecified: Secondary | ICD-10-CM

## 2014-03-25 MED ORDER — PSEUDOEPHEDRINE HCL 60 MG PO TABS: 60.0000 mg | ORAL_TABLET | Freq: Four times a day (QID) | ORAL | Status: DC | PRN

## 2014-03-25 MED ORDER — IPRATROPIUM BROMIDE 0.03 % NA SOLN: 2.0000 | Freq: Two times a day (BID) | NASAL | Status: DC

## 2014-03-25 NOTE — Patient Instructions (Signed)
I think your symptoms are most likely due to a viral uri. Please take the mucinex twice daily, please take the sudafed every 6 hours as needed for congestion, and please place 2 sprays of the atrovent into each nostril twice daily.  Be sure to drink plenty of fluids, get plenty of rest, and take tylenol as needed for the headache. Please return to clinic in 5 days if youre not feeling better.

## 2014-03-25 NOTE — Progress Notes (Signed)
Subjective:    Patient ID: Juan GivensLuis [name], male    DOB: 1965/08/14, 49 y.o.   MRN: [id number]  PCP: No PCP Per Patient  Chief Complaint  Patient presents with  . Headache  . Nasal Congestion   There are no active problems to display for this patient.  Prior to Admission medications   Medication Sig Start Date End Date Taking? Authorizing Provider  hydrochlorothiazide (MICROZIDE) 12.5 MG capsule Take 12.5 mg by mouth daily.   Yes Historical Provider, MD  citalopram (CELEXA) 20 MG tablet  10/18/13   Historical Provider, MD  ipratropium (ATROVENT) 0.03 % nasal spray Place 2 sprays into both nostrils 2 (two) times daily. 03/25/14   Raelyn Ensignodd Jaimarie Rapozo, PA  pseudoephedrine (SUDAFED) 60 MG tablet Take 1 tablet (60 mg total) by mouth every 6 (six) hours as needed for congestion. 03/25/14   Raelyn Ensignodd Atwood Adcock, PA   Medications, allergies, past medical history, surgical history, family history, social history and problem list reviewed and updated.  HPI  49 yom with no significant pmh presents with 3 day h/o runny nose, HA, head congestion.  Sx started fairly gradually 3 days ago with runny nose and head congestion. The next day he developed a frontal HA along with sneezing which have both been persistent for past 2 days. Denies any vision changes, numbness, weakness, or tingling. Denies otalgia, sore throat, cough, cp, fever, chills, body aches, n/v, diarrhea, abd pain. Has had mild sob past couple days he thinks due to his congestion.   Denies hx allergies. Denies itchy/watery eyes.   He was seen here in 8/15 and started on hctz for htn and celexa for depression. Has been taking hctz daily with no complaints. Did not take celexa at all. He finished his split with his wife. He saw a psych for a few months and is now feeling much better.   Review of Systems See HPI.     Objective:   Physical Exam  Constitutional: He is oriented to person, place, and time. He appears well-developed and well-nourished.   Non-toxic appearance. He does not have a sickly appearance. He does not appear ill. No distress.  BP 124/78 mmHg  Pulse 86  Temp(Src) 98.3 F (36.8 C) (Oral)  Resp 17  Ht 5' 6.5" (1.689 m)  Wt 189 lb (85.73 kg)  BMI 30.05 kg/m2  SpO2 98%   HENT:  Right Ear: Tympanic membrane is not erythematous. No middle ear effusion.  Left Ear: Tympanic membrane is not erythematous. A middle ear effusion is present.  Nose: Mucosal edema and rhinorrhea present. Right sinus exhibits no maxillary sinus tenderness and no frontal sinus tenderness. Left sinus exhibits no maxillary sinus tenderness and no frontal sinus tenderness.  Mouth/Throat: Uvula is midline and oropharynx is clear and moist. No oropharyngeal exudate, posterior oropharyngeal edema, posterior oropharyngeal erythema or tonsillar abscesses.  Eyes: Conjunctivae and EOM are normal. Pupils are equal, round, and reactive to light.  Neck: No Brudzinski's sign noted.  Cardiovascular: Normal rate, regular rhythm and normal heart sounds.  Exam reveals no gallop.   No murmur heard. Pulmonary/Chest: Effort normal and breath sounds normal. He has no decreased breath sounds. He has no wheezes. He has no rhonchi. He has no rales.  Lymphadenopathy:       Head (right side): No submental, no submandibular and no tonsillar adenopathy present.       Head (left side): Submandibular adenopathy present. No submental and no tonsillar adenopathy present.    He has no cervical adenopathy.  One enlarged soft freely mobile LN.   Neurological: He is alert and oriented to person, place, and time.      Assessment & Plan:   49 yom with no significant pmh presents with 3 day h/o runny nose, HA, head congestion.  Sinus congestion - Plan: pseudoephedrine (SUDAFED) 60 MG tablet, ipratropium (ATROVENT) 0.03 % nasal spray Sinus headache - Plan: pseudoephedrine (SUDAFED) 60 MG tablet, ipratropium (ATROVENT) 0.03 % nasal spray --doubt viral infx, doubt flu, most likely  viral uri with normal vitals, gradual onset, and relatively normal exam today --sudafed, mucinex, atrovent, fluids, rest, tylenol --rtc 5 days if not improving  Donnajean Lopes, PA-C Physician Assistant-Certified [facility]  03/25/2014 12:02 PM

## 2015-03-14 ENCOUNTER — Ambulatory Visit (INDEPENDENT_AMBULATORY_CARE_PROVIDER_SITE_OTHER): Payer: 59 | Admitting: Family Medicine

## 2015-03-14 VITALS — BP 126/82 | HR 89 | Temp 98.1°F | Resp 16 | Ht 67.5 in | Wt 193.0 lb

## 2015-03-14 DIAGNOSIS — R05 Cough: Secondary | ICD-10-CM | POA: Diagnosis not present

## 2015-03-14 DIAGNOSIS — Z136 Encounter for screening for cardiovascular disorders: Secondary | ICD-10-CM

## 2015-03-14 DIAGNOSIS — Z0130 Encounter for examination of blood pressure without abnormal findings: Secondary | ICD-10-CM

## 2015-03-14 DIAGNOSIS — R059 Cough, unspecified: Secondary | ICD-10-CM

## 2015-03-14 MED ORDER — ALBUTEROL SULFATE HFA 108 (90 BASE) MCG/ACT IN AERS: 2.0000 | INHALATION_SPRAY | Freq: Four times a day (QID) | RESPIRATORY_TRACT | Status: DC | PRN

## 2015-03-14 MED ORDER — BENZONATATE 100 MG PO CAPS: 200.0000 mg | ORAL_CAPSULE | Freq: Three times a day (TID) | ORAL | Status: DC | PRN

## 2015-03-14 NOTE — Progress Notes (Signed)
[facility] [address], [place] [id number] [phone]  Date:  03/14/2015   Name:  Juan [name]   DOB:  [birth year]   MRN:  [id number]  PCP:  No PCP Per Patient    Chief Complaint: Medication Refill; Atrial Fibrillation; and Sore Throat   History of Present Illness:  Juan [name] is a 50 y.o. very pleasant male patient who presents with the following: He ran out of his BP medication- he takes HCTZ. He has been out for 6 months or so.  He has not been checking his BP readings. He is NOT fasting today.  He needs a PCP as he does not really have one.  He has been coughing for about one month.  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  He does not have any fever or chills, he OW feels good.   The cough is usually dry but he does spit up some material in the am- clear mucus He has tried some mucinex DM so far BP Readings from Last 3 Encounters:  03/14/15 126/82  03/25/14 124/78  01/01/14 122/78    There are no active problems to display for this patient.   Past Medical History  Diagnosis Date  . Hypertension     Past Surgical History  Procedure Laterality Date  . Hernia repair      Social History  Substance Use Topics  . Smoking status: Never Smoker   . Smokeless tobacco: None  . Alcohol Use: No    Family History  Problem Relation Age of Onset  . Diabetes Mother   . Diabetes Father   . Heart disease Father   . Stroke Father   . Stroke Brother     Allergies  Allergen Reactions  . Pork-Derived Products Anaphylaxis  . Shellfish Allergy Anaphylaxis    Medication list has been reviewed and updated.  Current Outpatient Prescriptions on File Prior to Visit  Medication Sig Dispense Refill  . hydrochlorothiazide (MICROZIDE) 12.5 MG capsule Take 12.5 mg by mouth daily.    . citalopram (CELEXA) 20 MG tablet Reported on 03/14/2015    . ipratropium (ATROVENT) 0.03 %  nasal spray Place 2 sprays into both nostrils 2 (two) times daily. (Patient not taking: Reported on 03/14/2015) 30 mL 2  . pseudoephedrine (SUDAFED) 60 MG tablet Take 1 tablet (60 mg total) by mouth every 6 (six) hours as needed for congestion. (Patient not taking: Reported on 03/14/2015) 30 tablet 0   No current facility-administered medications on file prior to visit.    Review of Systems:  As per HPI- otherwise negative.   Physical Examination: Filed Vitals:   03/14/15 1157  BP: 126/82  Pulse: 89  Temp: 98.1 F (36.7 C)  Resp: 16   Filed Vitals:   03/14/15 1157  Height: 5' 7.5" (1.715 m)  Weight: 193 lb (87.544 kg)   Body mass index is 29.76 kg/(m^2). Ideal Body Weight: Weight in (lb) to have BMI = 25: 161.7  GEN: WDWN, NAD, Non-toxic, A & O x 3, looks well, muscular build HEENT: Atraumatic, Normocephalic. Neck supple. No masses, No LAD.  Bilateral TM wnl, oropharynx normal.  PEERL,EOMI.   Nasal cavity is congested  Ears and Nose: No external deformity. CV: RRR, No M/G/R. No JVD. No thrill. No extra heart sounds. PULM: CTA B, no wheezes, crackles, rhonchi. No retractions. No resp. distress.  No accessory muscle use. EXTR: No c/c/e NEURO Normal gait.  PSYCH: Normally interactive. Conversant. Not depressed or anxious appearing.  Calm demeanor.    Assessment and Plan: Cough - Plan: albuterol (PROVENTIL HFA;VENTOLIN HFA) 108 (90 Base) MCG/ACT inhaler, benzonatate (TESSALON) 100 MG capsule  Blood pressure check  His BP is fine off meds for some time. He will monitor this at home and let me know if he starts running higher.  Encouraged him to come in for fasting labs soon Likely allergic cough Albuterol as needed, tessalon perles as needed Asked him to let me know if not feeling better soon  Signed Abbe Amsterdam, MD

## 2015-03-14 NOTE — Patient Instructions (Signed)
Your blood pressure looks ok today- at this time I would not but you back on BP medication Please check your pressure every week or so at work; as long as you are not running higher than 140/90 we will not restart your medication  For your cough, use the tessalon perles as needed and the inhaler as needed prior to exposure to dust.  This will help with your cough. I would also suggest that you try an OTC allergy medication such as zyrtec or claritin If your cough is not better in a week or so give me a call- Sooner if worse.

## 2015-03-15 ENCOUNTER — Telehealth: Payer: Self-pay

## 2015-03-15 NOTE — Telephone Encounter (Signed)
Please verify pharmacy. Left message for pt to call back.

## 2015-03-15 NOTE — Telephone Encounter (Signed)
Pt was seen yesterday and none of his rx is at the pharmacy   Please call [phone]

## 2015-03-19 DIAGNOSIS — H52223 Regular astigmatism, bilateral: Secondary | ICD-10-CM | POA: Diagnosis not present

## 2015-03-19 DIAGNOSIS — H524 Presbyopia: Secondary | ICD-10-CM | POA: Diagnosis not present

## 2015-03-19 DIAGNOSIS — H5203 Hypermetropia, bilateral: Secondary | ICD-10-CM | POA: Diagnosis not present

## 2015-04-10 ENCOUNTER — Ambulatory Visit (INDEPENDENT_AMBULATORY_CARE_PROVIDER_SITE_OTHER): Payer: 59 | Admitting: Family Medicine

## 2015-04-10 VITALS — BP 132/84 | HR 75 | Temp 98.3°F | Resp 16 | Ht 67.5 in | Wt 197.2 lb

## 2015-04-10 DIAGNOSIS — Z0000 Encounter for general adult medical examination without abnormal findings: Secondary | ICD-10-CM | POA: Diagnosis not present

## 2015-04-10 DIAGNOSIS — F524 Premature ejaculation: Secondary | ICD-10-CM

## 2015-04-10 DIAGNOSIS — I10 Essential (primary) hypertension: Secondary | ICD-10-CM | POA: Insufficient documentation

## 2015-04-10 LAB — POCT URINALYSIS DIP (MANUAL ENTRY)
Bilirubin, UA: NEGATIVE
Glucose, UA: NEGATIVE
Ketones, POC UA: NEGATIVE
Leukocytes, UA: NEGATIVE
Nitrite, UA: NEGATIVE
Protein Ur, POC: NEGATIVE
Spec Grav, UA: 1.01
Urobilinogen, UA: 0.2
pH, UA: 5.5

## 2015-04-10 LAB — POCT CBC
Granulocyte percent: 63.8 %G (ref 37–80)
HCT, POC: 44 % (ref 43.5–53.7)
Hemoglobin: 15.5 g/dL (ref 14.1–18.1)
Lymph, poc: 2.1 (ref 0.6–3.4)
MCH, POC: 28.3 pg (ref 27–31.2)
MCHC: 35.3 g/dL (ref 31.8–35.4)
MCV: 80.2 fL (ref 80–97)
MID (cbc): 0.5 (ref 0–0.9)
MPV: 8.5 fL (ref 0–99.8)
POC Granulocyte: 4.7 (ref 2–6.9)
POC LYMPH PERCENT: 29.3 %L (ref 10–50)
POC MID %: 6.9 %M (ref 0–12)
Platelet Count, POC: 187 10*3/uL (ref 142–424)
RBC: 5.49 M/uL (ref 4.69–6.13)
RDW, POC: 12.5 %
WBC: 7.3 10*3/uL (ref 4.6–10.2)

## 2015-04-10 LAB — COMPLETE METABOLIC PANEL WITH GFR
ALT: 23 U/L (ref 9–46)
AST: 24 U/L (ref 10–40)
Albumin: 4.2 g/dL (ref 3.6–5.1)
Alkaline Phosphatase: 54 U/L (ref 40–115)
BUN: 9 mg/dL (ref 7–25)
CO2: 28 mmol/L (ref 20–31)
Calcium: 9.1 mg/dL (ref 8.6–10.3)
Chloride: 102 mmol/L (ref 98–110)
Creat: 0.66 mg/dL (ref 0.60–1.35)
GFR, Est African American: >89 mL/min (ref >=60)
GFR, Est Non African American: >89 mL/min (ref >=60)
Glucose, Bld: 94 mg/dL (ref 65–99)
Potassium: 3.9 mmol/L (ref 3.5–5.3)
Sodium: 136 mmol/L (ref 135–146)
Total Bilirubin: 0.6 mg/dL (ref 0.2–1.2)
Total Protein: 7.5 g/dL (ref 6.1–8.1)

## 2015-04-10 LAB — LIPID PANEL
Cholesterol: 160 mg/dL (ref 125–200)
HDL: 30 mg/dL — ABNORMAL LOW (ref >=40)
LDL Cholesterol: 91 mg/dL (ref <130)
Total CHOL/HDL Ratio: 5.3 Ratio — ABNORMAL HIGH (ref <=5.0)
Triglycerides: 194 mg/dL — ABNORMAL HIGH (ref <150)
VLDL: 39 mg/dL — ABNORMAL HIGH (ref <30)

## 2015-04-10 MED ORDER — METOPROLOL TARTRATE 100 MG PO TABS: 100.0000 mg | ORAL_TABLET | Freq: Two times a day (BID) | ORAL | Status: DC

## 2015-04-10 NOTE — Patient Instructions (Signed)

## 2015-04-10 NOTE — Progress Notes (Addendum)
By signing my name below, I, Juan [name], attest that this documentation has been prepared under the direction and in the presence of Elvina Sidle, MD. Electronically Signed: [name], Scribe. 04/10/2015 , 9:22 AM .  Patient was seen in room 12 .   Patient ID: [name] MRN: [id number], DOB: [birth year], 50 y.o. Date of Encounter: 04/10/2015  Primary Physician: No PCP Per Patient  Chief Complaint:  Chief Complaint  Patient presents with  . Annual Exam  . Hypertension    HPI:  Juan [name] is a 50 y.o. male who presents to [facility] for annual physical. Strong family history of hypertension and diabetes  Diet He notes that he eats plenty of vegetables. He used to be a vegetarian.   Exercise He used to work out and regularly exercising. But, now that he can work from home, he stopped going out to exercise or to the gym.   Family History He has a strong family history of diabetes.   HTN He's been taking his wife's BP medication (Labetalol) because his BP has been running high. Often his blood pressures are says with headaches and the systolic is over 956. Wife is a Engineer, civil (consulting) and she is continuing to monitor his blood pressures  Personal He's in with his wife today, both having complete physicals.  He has 5 children: 34, twins at 43, 1 and 30 year olds.   GU He notes having premature ejaculation.   Immunizations He denies flu shot.  He denies knowledge of last tetanus shot.   He works in Editor, commissioning.   Past Medical History  Diagnosis Date  . Hypertension      Home Meds: Prior to Admission medications   Medication Sig Start Date End Date Taking? Authorizing Provider  albuterol (PROVENTIL HFA;VENTOLIN HFA) 108 (90 Base) MCG/ACT inhaler Inhale 2 puffs into the lungs every 6 (six) hours as needed for wheezing. Use for cough 03/14/15  Yes Gwenlyn Found Copland, MD  benzonatate (TESSALON) 100 MG capsule Take 2 capsules (200 mg total) by mouth 3  (three) times daily as needed for cough. Patient not taking: Reported on 04/10/2015 03/14/15   Pearline Cables, MD  citalopram (CELEXA) 20 MG tablet Reported on 04/10/2015 10/18/13   Historical Provider, MD  ipratropium (ATROVENT) 0.03 % nasal spray Place 2 sprays into both nostrils 2 (two) times daily. Patient not taking: Reported on 03/14/2015 03/25/14   Raelyn Ensign, PA  pseudoephedrine (SUDAFED) 60 MG tablet Take 1 tablet (60 mg total) by mouth every 6 (six) hours as needed for congestion. Patient not taking: Reported on 03/14/2015 03/25/14   Raelyn Ensign, PA    Allergies:  Allergies  Allergen Reactions  . Pork-Derived Products Anaphylaxis  . Shellfish Allergy Anaphylaxis    Social History   Social History  . Marital Status: Married    Spouse Name: N/A  . Number of Children: N/A  . Years of Education: N/A   Occupational History  . Not on file.   Social History Main Topics  . Smoking status: Never Smoker   . Smokeless tobacco: Not on file  . Alcohol Use: No  . Drug Use: No  . Sexual Activity: Not on file   Other Topics Concern  . Not on file   Social History Narrative     Review of Systems: Constitutional: negative for fever, chills, night sweats, weight changes, or fatigue  HEENT: negative for vision changes, hearing loss, congestion, rhinorrhea, ST, epistaxis, or sinus pressure  Cardiovascular: negative for chest pain or palpitations Respiratory: negative for hemoptysis, wheezing, shortness of breath, or cough Abdominal: negative for abdominal pain, nausea, vomiting, diarrhea, or constipation Dermatological: negative for rash Neurologic: negative for headache, dizziness, or syncope All other systems reviewed and are otherwise negative with the exception to those above and in the HPI.  Physical Exam: Blood pressure 132/84, pulse 75, temperature 98.3 F (36.8 C), resp. rate 16, height 5' 7.5" (1.715 m), weight 197 lb 3.2 oz (89.449 kg), SpO2 98 %., Body mass index is 30.41  kg/(m^2). General: Well developed, well nourished, in no acute distress. Head: Normocephalic, atraumatic, eyes without discharge, sclera non-icteric, nares are without discharge. Bilateral auditory canals clear, TM's are without perforation, pearly grey and translucent with reflective cone of light bilaterally. Oral cavity moist, posterior pharynx without exudate, erythema, peritonsillar abscess, or post nasal drip.  Neck: Supple. No thyromegaly. Full ROM. No lymphadenopathy. Lungs: Clear bilaterally to auscultation without wheezes, rales, or rhonchi. Breathing is unlabored. Heart: RRR with S1 S2. No murmurs, rubs, or gallops appreciated. Abdomen: Soft, non-tender, non-distended with normoactive bowel sounds. No hepatomegaly. No rebound/guarding. No obvious abdominal masses. Msk:  Strength and tone normal for age. Extremities/Skin: Warm and dry. No clubbing or cyanosis. No edema. No rashes or suspicious lesions. Neuro: Alert and oriented X 3. Moves all extremities spontaneously. Gait is normal. CNII-XII grossly in tact. Psych:  Responds to questions appropriately with a normal affect.   Labs: Results for orders placed or performed in visit on 04/10/15  POCT CBC  Result Value Ref Range   WBC 7.3 4.6 - 10.2 K/uL   Lymph, poc 2.1 0.6 - 3.4   POC LYMPH PERCENT 29.3 10 - 50 %L   MID (cbc) 0.5 0 - 0.9   POC MID % 6.9 0 - 12 %M   POC Granulocyte 4.7 2 - 6.9   Granulocyte percent 63.8 37 - 80 %G   RBC 5.49 4.69 - 6.13 M/uL   Hemoglobin 15.5 14.1 - 18.1 g/dL   HCT, POC 14.7 82.9 - 53.7 %   MCV 80.2 80 - 97 fL   MCH, POC 28.3 27 - 31.2 pg   MCHC 35.3 31.8 - 35.4 g/dL   RDW, POC 56.2 %   Platelet Count, POC 187 142 - 424 K/uL   MPV 8.5 0 - 99.8 fL  POCT urinalysis dipstick  Result Value Ref Range   Color, UA yellow yellow   Clarity, UA clear clear   Glucose, UA negative negative   Bilirubin, UA negative negative   Ketones, POC UA negative negative   Spec Grav, UA 1.010    Blood, UA  trace-intact (A) negative   pH, UA 5.5    Protein Ur, POC negative negative   Urobilinogen, UA 0.2    Nitrite, UA Negative Negative   Leukocytes, UA Negative Negative     ASSESSMENT AND PLAN:  50 y.o. year old male with  This chart was scribed in my presence and reviewed by me personally.    ICD-9-CM ICD-10-CM   1. Annual physical exam V70.0 Z00.00 POCT CBC     COMPLETE METABOLIC PANEL WITH GFR     Lipid panel     POCT urinalysis dipstick  2. Essential hypertension 401.9 I10 metoprolol (LOPRESSOR) 100 MG tablet  3. 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

## 2015-04-11 MED FILL — METOPROLOL TARTRATE 100 MG: 100 | 90 days supply | Qty: 180 | Fill #0

## 2015-04-22 ENCOUNTER — Ambulatory Visit: Payer: 59

## 2015-04-24 ENCOUNTER — Ambulatory Visit (INDEPENDENT_AMBULATORY_CARE_PROVIDER_SITE_OTHER): Payer: 59 | Admitting: Emergency Medicine

## 2015-04-24 VITALS — BP 130/70 | HR 77 | Temp 98.4°F | Resp 16 | Ht 67.0 in | Wt 193.0 lb

## 2015-04-24 DIAGNOSIS — E781 Pure hyperglyceridemia: Secondary | ICD-10-CM

## 2015-04-24 DIAGNOSIS — I10 Essential (primary) hypertension: Secondary | ICD-10-CM

## 2015-04-24 NOTE — Patient Instructions (Signed)
Decrease your Lopressor to one half tablet twice a day.   Food Choices to Lower Your Triglycerides Triglycerides are a type of fat in your blood. High levels of triglycerides can increase the risk of heart disease and stroke. If your triglyceride levels are high, the foods you eat and your eating habits are very important. Choosing the right foods can help lower your triglycerides.  WHAT GENERAL GUIDELINES DO I NEED TO FOLLOW?  Lose weight if you are overweight.   Limit or avoid alcohol.   Fill one half of your plate with vegetables and green salads.   Limit fruit to two servings a day. Choose fruit instead of juice.   Make one fourth of your plate whole grains. Look for the word "whole" as the first word in the ingredient list.  Fill one fourth of your plate with lean protein foods.  Enjoy fatty fish (such as salmon, mackerel, sardines, and tuna) three times a week.   Choose healthy fats.   Limit foods high in starch and sugar.  Eat more home-cooked food and less restaurant, buffet, and fast food.  Limit fried foods.  Cook foods using methods other than frying.  Limit saturated fats.  Check ingredient lists to avoid foods with partially hydrogenated oils (trans fats) in them. WHAT FOODS CAN I EAT?  Grains Whole grains, such as whole wheat or whole grain breads, crackers, cereals, and pasta. Unsweetened oatmeal, bulgur, barley, quinoa, or brown rice. Corn or whole wheat flour tortillas.  Vegetables Fresh or frozen vegetables (raw, steamed, roasted, or grilled). Green salads. Fruits All fresh, canned (in natural juice), or frozen fruits. Meat and Other Protein Products Ground beef (85% or leaner), grass-fed beef, or beef trimmed of fat. Skinless chicken or [place]. Ground chicken or [place]. Pork trimmed of fat. All fish and seafood. Eggs. Dried beans, peas, or lentils. Unsalted nuts or seeds. Unsalted canned or dry beans. Dairy Low-fat dairy products, such as skim or 1%  milk, 2% or reduced-fat cheeses, low-fat ricotta or cottage cheese, or plain low-fat yogurt. Fats and Oils Tub margarines without trans fats. Light or reduced-fat mayonnaise and salad dressings. Avocado. Safflower, olive, or canola oils. Natural peanut or almond butter. The items listed above may not be a complete list of recommended foods or beverages. Contact your dietitian for more options. WHAT FOODS ARE NOT RECOMMENDED?  Grains White bread. White pasta. White rice. Cornbread. Bagels, pastries, and croissants. Crackers that contain trans fat. Vegetables White potatoes. Corn. Creamed or fried vegetables. Vegetables in a cheese sauce. Fruits Dried fruits. Canned fruit in light or heavy syrup. Fruit juice. Meat and Other Protein Products Fatty cuts of meat. Ribs, chicken wings, bacon, sausage, bologna, salami, chitterlings, fatback, hot dogs, bratwurst, and packaged luncheon meats. Dairy Whole or 2% milk, cream, half-and-half, and cream cheese. Whole-fat or sweetened yogurt. Full-fat cheeses. Nondairy creamers and whipped toppings. Processed cheese, cheese spreads, or cheese curds. Sweets and Desserts Corn syrup, sugars, honey, and molasses. Candy. Jam and jelly. Syrup. Sweetened cereals. Cookies, pies, cakes, donuts, muffins, and ice cream. Fats and Oils Butter, stick margarine, lard, shortening, ghee, or bacon fat. Coconut, palm kernel, or palm oils. Beverages Alcohol. Sweetened drinks (such as sodas, lemonade, and fruit drinks or punches). The items listed above may not be a complete list of foods and beverages to avoid. Contact your dietitian for more information.   This information is not intended to replace advice given to you by your health care provider. Make sure you discuss any questions  you have with your health care provider.   Document Released: 12/08/2003 Document Revised: 03/12/2014 Document Reviewed: 12/24/2012 Elsevier Interactive Patient Education [employer]

## 2015-04-24 NOTE — Progress Notes (Signed)
Chief Complaint:  Chief Complaint  Patient presents with  . Follow-up    lab results, high cholesterol  . Depression    HPI: Juan [name] is a 51 y.o. male who is here for 2 problems. One is that he wants to discuss his lipid panel which showed a low HDL and elevated triglycerides. The second problem he has been having his fatigue since starting on Lopressor 100 mg twice a day. He states he has no energy during the day and will prefer to just stay in bed. He denies any chest pain shortness of breath or other new symptoms. Of note he has joined the [facility] and is ready to start an exercise program.  Past Medical History  Diagnosis Date  . Hypertension    Past Surgical History  Procedure Laterality Date  . Hernia repair     Social History   Social History  . Marital Status: Married    Spouse Name: N/A  . Number of Children: N/A  . Years of Education: N/A   Social History Main Topics  . Smoking status: Never Smoker   . Smokeless tobacco: None  . Alcohol Use: No  . Drug Use: No  . Sexual Activity: Not Asked   Other Topics Concern  . None   Social History Narrative   Family History  Problem Relation Age of Onset  . Diabetes Mother   . Diabetes Father   . Heart disease Father   . Stroke Father   . Stroke Brother    Allergies  Allergen Reactions  . Pork-Derived Products Anaphylaxis  . Shellfish Allergy Anaphylaxis  . Lisinopril Cough   Prior to Admission medications   Medication Sig Start Date End Date Taking? Authorizing Provider  metoprolol (LOPRESSOR) 100 MG tablet Take 1 tablet (100 mg total) by mouth 2 (two) times daily. 04/10/15  Yes Elvina Sidle, MD     ROS: The patient denies fevers, chills, night sweats, unintentional weight loss, chest pain, palpitations, wheezing, dyspnea on exertion, nausea, vomiting, abdominal pain, dysuria, hematuria, melena, numbness, weakness, or tingling.   All other systems have been reviewed and were otherwise negative  with the exception of those mentioned in the HPI and as above.    PHYSICAL EXAM: Filed Vitals:   04/24/15 0932  BP: 130/70  Pulse: 77  Temp: 98.4 F (36.9 C)  Resp: 16   Filed Vitals:   04/24/15 0932  Height:  (1.702 m)  Weight: 193 lb (87.544 kg)   Body mass index is 30.22 kg/(m^2).   General: Alert, no acute distress HEENT:  Normocephalic, atraumatic, oropharynx patent. EOMI, PERRLA Cardiovascular:  Regular rate and rhythm, no rubs murmurs or gallops.  No Carotid bruits, radial pulse intact. No pedal edema.  Respiratory: Clear to auscultation bilaterally.  No wheezes, rales, or rhonchi.  No cyanosis, no use of accessory musculature GI: No organomegaly, abdomen is soft and non-tender, positive bowel sounds.  No masses. Skin: No rashes. Neurologic: Facial musculature symmetric. Psychiatric: Patient is appropriate throughout our interaction. Lymphatic: No cervical lymphadenopathy Musculoskeletal: Gait intact.   LABS: Results for orders placed or performed in visit on 04/10/15  COMPLETE METABOLIC PANEL WITH GFR  Result Value Ref Range   Sodium 136 135 - 146 mmol/L   Potassium 3.9 3.5 - 5.3 mmol/L   Chloride 102 98 - 110 mmol/L   CO2 28 20 - 31 mmol/L   Glucose, Bld 94 65 - 99 mg/dL   BUN 9 7 - 25  mg/dL   Creat 9.60 4.54 - 0.98 mg/dL   Total Bilirubin 0.6 0.2 - 1.2 mg/dL   Alkaline Phosphatase 54 40 - 115 U/L   AST 24 10 - 40 U/L   ALT 23 9 - 46 U/L   Total Protein 7.5 6.1 - 8.1 g/dL   Albumin 4.2 3.6 - 5.1 g/dL   Calcium 9.1 8.6 - 11.9 mg/dL   GFR, Est African American >89 >=60 mL/min   GFR, Est Non African American >89 >=60 mL/min  Lipid panel  Result Value Ref Range   Cholesterol 160 125 - 200 mg/dL   Triglycerides 147 (H) <150 mg/dL   HDL 30 (L) >=82 mg/dL   Total CHOL/HDL Ratio 5.3 (H) <=5.0 Ratio   VLDL 39 (H) <30 mg/dL   LDL Cholesterol 91 <956 mg/dL  POCT CBC  Result Value Ref Range   WBC 7.3 4.6 - 10.2 K/uL   Lymph, poc 2.1 0.6 - 3.4   POC  LYMPH PERCENT 29.3 10 - 50 %L   MID (cbc) 0.5 0 - 0.9   POC MID % 6.9 0 - 12 %M   POC Granulocyte 4.7 2 - 6.9   Granulocyte percent 63.8 37 - 80 %G   RBC 5.49 4.69 - 6.13 M/uL   Hemoglobin 15.5 14.1 - 18.1 g/dL   HCT, POC 21.3 08.6 - 53.7 %   MCV 80.2 80 - 97 fL   MCH, POC 28.3 27 - 31.2 pg   MCHC 35.3 31.8 - 35.4 g/dL   RDW, POC 57.8 %   Platelet Count, POC 187 142 - 424 K/uL   MPV 8.5 0 - 99.8 fL  POCT urinalysis dipstick  Result Value Ref Range   Color, UA yellow yellow   Clarity, UA clear clear   Glucose, UA negative negative   Bilirubin, UA negative negative   Ketones, POC UA negative negative   Spec Grav, UA 1.010    Blood, UA trace-intact (A) negative   pH, UA 5.5    Protein Ur, POC negative negative   Urobilinogen, UA 0.2    Nitrite, UA Negative Negative   Leukocytes, UA Negative Negative     EKG/XRAY:   Primary read interpreted by Dr. Cleta Alberts at [facility].   ASSESSMENT/PLAN: Patient has an elevated triglyceride and low HDL. We'll hold off on medications at the present time and focus on weight loss and exercise. He also has fatigue I suspect this is medication related will decrease his Lopressor to one half tablet twice a day. Lucilla Edin M.D.   Gross sideeffects, risk and benefits, and alternatives of medications d/w patient. Patient is aware that all medications have potential sideeffects and we are unable to predict every sideeffect or drug-drug interaction that may occur.  @ 04/24/2015 10:58 AM

## 2015-08-21 ENCOUNTER — Emergency Department (HOSPITAL_COMMUNITY): Payer: 59

## 2015-08-21 ENCOUNTER — Emergency Department (HOSPITAL_COMMUNITY)
Admission: EM | Admit: 2015-08-21 | Discharge: 2015-08-21 | Disposition: A | Discharge status: Home / Self Care | Payer: 59 | Attending: Emergency Medicine | Admitting: Emergency Medicine

## 2015-08-21 ENCOUNTER — Encounter (HOSPITAL_COMMUNITY): Payer: Self-pay

## 2015-08-21 DIAGNOSIS — Y939 Activity, unspecified: Secondary | ICD-10-CM | POA: Diagnosis not present

## 2015-08-21 DIAGNOSIS — Y999 Unspecified external cause status: Secondary | ICD-10-CM | POA: Insufficient documentation

## 2015-08-21 DIAGNOSIS — W60XXXA Contact with nonvenomous plant thorns and spines and sharp leaves, initial encounter: Secondary | ICD-10-CM | POA: Diagnosis not present

## 2015-08-21 DIAGNOSIS — Q2731 Arteriovenous malformation of vessel of upper limb: Secondary | ICD-10-CM | POA: Diagnosis not present

## 2015-08-21 DIAGNOSIS — Q2739 Arteriovenous malformation, other site: Secondary | ICD-10-CM | POA: Diagnosis not present

## 2015-08-21 DIAGNOSIS — I10 Essential (primary) hypertension: Secondary | ICD-10-CM | POA: Insufficient documentation

## 2015-08-21 DIAGNOSIS — Y929 Unspecified place or not applicable: Secondary | ICD-10-CM | POA: Diagnosis not present

## 2015-08-21 DIAGNOSIS — S6992XA Unspecified injury of left wrist, hand and finger(s), initial encounter: Secondary | ICD-10-CM | POA: Diagnosis present

## 2015-08-21 DIAGNOSIS — Q2730 Arteriovenous malformation, site unspecified: Secondary | ICD-10-CM

## 2015-08-21 DIAGNOSIS — S61233A Puncture wound without foreign body of left middle finger without damage to nail, initial encounter: Secondary | ICD-10-CM | POA: Diagnosis not present

## 2015-08-21 NOTE — ED Notes (Signed)
Patient transported to X-ray 

## 2015-08-21 NOTE — Discharge Instructions (Signed)
°  Follow up with the hand surgeon.  Return for worsening bleeding.

## 2015-08-21 NOTE — ED Notes (Signed)
Suture cart bedside. 

## 2015-08-21 NOTE — ED Provider Notes (Signed)
CSN: [id number]     Arrival date & time 08/21/15  0306 History  By signing my name below, I, Juan [name], attest that this documentation has been prepared under the direction and in the presence of Juan [name] Juan Mousel, DO. Electronically Signed: Ronney LionSuzanne [name], ED Scribe. 08/21/2015. 4:09 AM.    Chief Complaint  Patient presents with  . Finger Injury   The history is provided by the patient. No language interpreter was used.    HPI Comments: Juan [name] is a 50 y.o. male with a history of hypertension, who presents to the Emergency Department complaining of a bleeeding left middle finger injury that occurred 2 weeks ago. Patient states he had a thorn puncture and lodge in his left middle finger about 2 weeks ago, but the area has continued to bleed since. Patient denies any pain or numbness but states his only complaint is that the area has continued to bleed.   Past Medical History  Diagnosis Date  . Hypertension    Past Surgical History  Procedure Laterality Date  . Hernia repair     Family History  Problem Relation Age of Onset  . Diabetes Mother   . Diabetes Father   . Heart disease Father   . Stroke Father   . Stroke Brother    Social History  Substance Use Topics  . Smoking status: Never Smoker   . Smokeless tobacco: None  . Alcohol Use: No    Review of Systems  Constitutional: Negative for fever and chills.  HENT: Negative for congestion and facial swelling.   Eyes: Negative for discharge and visual disturbance.  Respiratory: Negative for shortness of breath.   Cardiovascular: Negative for chest pain and palpitations.  Gastrointestinal: Negative for vomiting, abdominal pain and diarrhea.  Musculoskeletal: Negative for myalgias and arthralgias.  Skin: Positive for wound. Negative for color change and rash.  Neurological: Negative for tremors, syncope, numbness and headaches.  Psychiatric/Behavioral: Negative for confusion and dysphoric mood.  All other systems reviewed and are  negative.     Allergies  Pork-derived products; Shellfish allergy; and Lisinopril  Home Medications   Prior to Admission medications   Medication Sig Start Date End Date Taking? Authorizing Provider  metoprolol (LOPRESSOR) 100 MG tablet Take 1 tablet (100 mg total) by mouth 2 (two) times daily. 04/10/15  Yes Elvina SidleKurt Lauenstein, MD   BP 141/106 mmHg  Pulse 87  Temp(Src) 97.8 F (36.6 C) (Oral)  Resp 16  SpO2 100% Physical Exam  Constitutional: He is oriented to person, place, and time. He appears well-developed and well-nourished. No distress.  HENT:  Head: Normocephalic and atraumatic.  Eyes: Conjunctivae and EOM are normal.  Neck: Neck supple. No tracheal deviation present.  Cardiovascular: Normal rate.   Pulmonary/Chest: Effort normal. No respiratory distress.  Musculoskeletal: Normal range of motion.  Circular area of erythema at the base of his left middle finger. He has active oozing from the area. Blanchable on palpation.   Neurological: He is alert and oriented to person, place, and time.  Skin: Skin is warm and dry.  Psychiatric: He has a normal mood and affect. His behavior is normal.  Nursing note and vitals reviewed.   ED Course  Procedures (including critical care time)   COORDINATION OF CARE: 4:08 AM - Discussed treatment plan with pt at bedside which includes referral to hand surgeon. Pt verbalized understanding and agreed to plan.  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 Improved with quick clot. Wrapped at bedside. We'll have him follow with hand surgery for possible removal.   I personally performed the services described in this documentation, which was scribed in my presence. The recorded information has been reviewed and is accurate.    5:43 AM:  I have discussed the diagnosis/risks/treatment options with the patient and family and believe the pt to be eligible for discharge home  to follow-up with PCP. We also discussed returning to the ED immediately if new or worsening sx occur. We discussed the sx which are most concerning (e.g., sudden worsening pain, fever, inability to tolerate by mouth) that necessitate immediate return. Medications administered to the patient during their visit and any new prescriptions provided to the patient are listed below.  Medications given during this visit Medications - No data to display  Discharge Medication List as of 08/21/2015  5:00 AM      The patient appears reasonably screen and/or stabilized for discharge and I doubt any other medical condition or other [facility] requiring further screening, evaluation, or treatment in the ED at this time prior to discharge.      Juan Plan, DO 08/21/15 [phone]

## 2015-08-21 NOTE — ED Notes (Signed)
Patient arrives with complaint of bleeding finger injury. States he had a thorn puncture and lodge in his left middle finger about 2 weeks ago and the area has continued to bleed since then. Assessment shows a circular puncture wound about 1/4" in diameter near the base of that finger. Area cleansed with water and then it began to bleed immediately. Area appears mildly swollen, sensation intact. Patient denies pain.

## 2015-12-19 MED FILL — METOPROLOL TARTRATE 100 MG: 100 | 90 days supply | Qty: 180 | Fill #1 | Status: TO

## 2016-01-14 ENCOUNTER — Ambulatory Visit: Payer: 59

## 2016-06-10 ENCOUNTER — Other Ambulatory Visit: Payer: Self-pay | Admitting: Family Medicine

## 2016-06-10 DIAGNOSIS — I10 Essential (primary) hypertension: Secondary | ICD-10-CM

## 2017-06-28 IMAGING — CR DG FINGER MIDDLE 2+V*L*
2 series · 2 of 2 positions shown · non-contrast
Comparison: None.

CLINICAL DATA: Puncture wound to the anterior left middle finger.
Splinter stuck in the finger 2 weeks ago. Removed splinter and has
not healed.

EXAM:
LEFT MIDDLE FINGER 2+V

[finger obl]
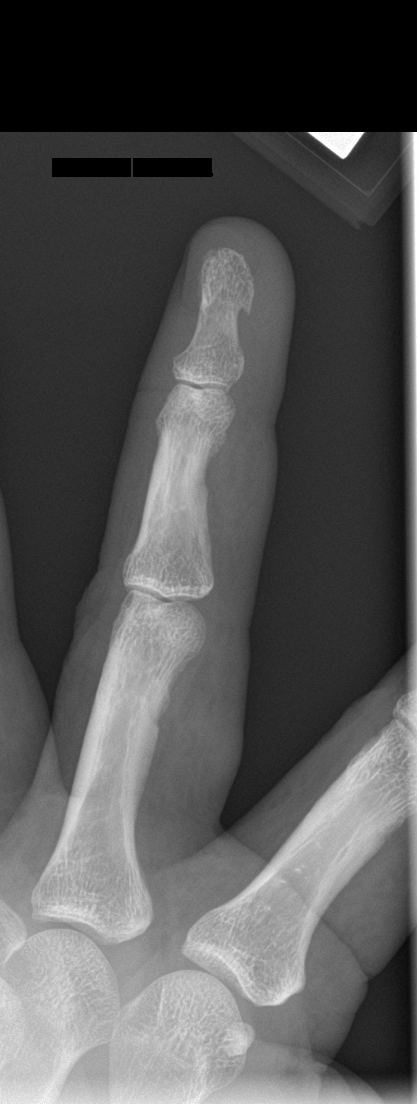

[finger lat]
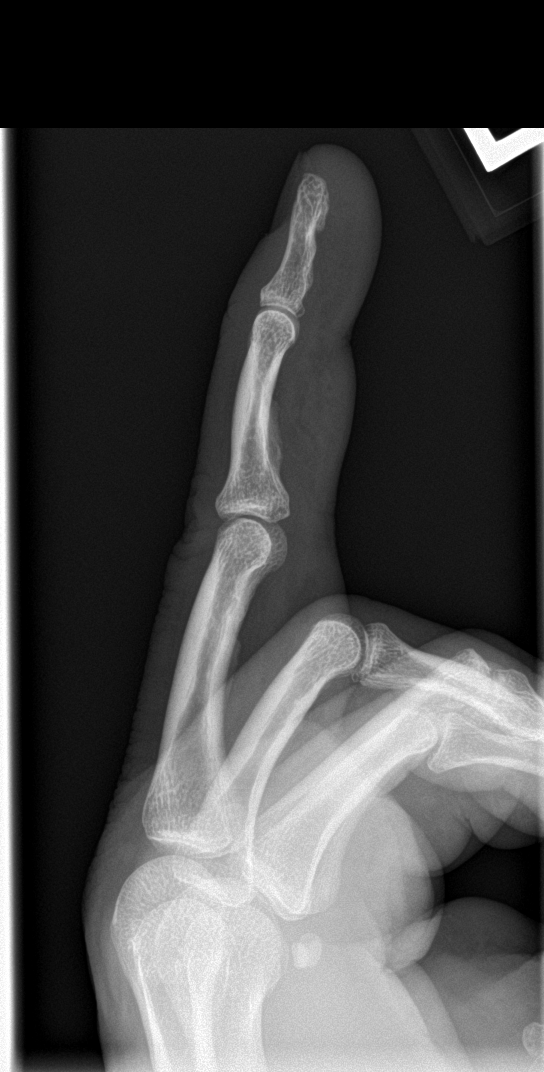

[2 of 2 positions shown; findings below may reference images not displayed]

FINDINGS: There is no evidence of fracture or dislocation. There is no
evidence of arthropathy or other focal bone abnormality. Soft
tissues are unremarkable. No radiopaque soft tissue foreign bodies
or gas collections.
IMPRESSION: Negative.
